# Patient Record
Sex: Female | Born: 1985 | Race: Black or African American | Hispanic: No | Marital: Married | Smoking: Never smoker
Health system: Southern US, Community
[De-identification: ages and names within clinical notes are randomized; demographics above are authoritative.]

## PROBLEM LIST (undated history)

## (undated) ENCOUNTER — Inpatient Hospital Stay (HOSPITAL_COMMUNITY): Payer: Medicaid Other

## (undated) ENCOUNTER — Inpatient Hospital Stay (HOSPITAL_COMMUNITY): Payer: Self-pay

## (undated) DIAGNOSIS — Z789 Other specified health status: Secondary | ICD-10-CM

## (undated) HISTORY — PX: NO PAST SURGERIES: SHX2092

---

## 2013-10-27 LAB — OB RESULTS CONSOLE GC/CHLAMYDIA
Chlamydia: NEGATIVE
GC PROBE AMP, GENITAL: NEGATIVE

## 2013-10-27 LAB — OB RESULTS CONSOLE RPR: RPR: NONREACTIVE

## 2013-10-27 LAB — OB RESULTS CONSOLE HIV ANTIBODY (ROUTINE TESTING): HIV: NONREACTIVE

## 2013-10-27 LAB — OB RESULTS CONSOLE PLATELET COUNT: Platelets: 193 10*3/uL

## 2013-10-27 LAB — OB RESULTS CONSOLE ABO/RH: RH Type: POSITIVE

## 2013-10-27 LAB — OB RESULTS CONSOLE ANTIBODY SCREEN: Antibody Screen: NEGATIVE

## 2013-10-27 LAB — OB RESULTS CONSOLE HEPATITIS B SURFACE ANTIGEN: Hepatitis B Surface Ag: NEGATIVE

## 2013-10-27 LAB — OB RESULTS CONSOLE HGB/HCT, BLOOD
HEMATOCRIT: 35 %
Hemoglobin: 11.3 g/dL

## 2013-10-27 LAB — OB RESULTS CONSOLE RUBELLA ANTIBODY, IGM: RUBELLA: IMMUNE

## 2013-10-27 LAB — SICKLE CELL SCREEN: Sickle Cell Screen: NEGATIVE

## 2014-02-20 ENCOUNTER — Encounter (HOSPITAL_COMMUNITY): Payer: Self-pay | Admitting: *Deleted

## 2014-02-20 ENCOUNTER — Inpatient Hospital Stay (HOSPITAL_COMMUNITY)
Admission: AD | Admit: 2014-02-20 | Discharge: 2014-02-20 | Disposition: A | Discharge status: Home / Self Care | Payer: Medicaid Other | Source: Ambulatory Visit | Attending: Family Medicine | Admitting: Family Medicine

## 2014-02-20 DIAGNOSIS — Z3A30 30 weeks gestation of pregnancy: Secondary | ICD-10-CM | POA: Diagnosis not present

## 2014-02-20 DIAGNOSIS — O26893 Other specified pregnancy related conditions, third trimester: Secondary | ICD-10-CM | POA: Insufficient documentation

## 2014-02-20 DIAGNOSIS — R040 Epistaxis: Secondary | ICD-10-CM | POA: Insufficient documentation

## 2014-02-20 DIAGNOSIS — O99013 Anemia complicating pregnancy, third trimester: Secondary | ICD-10-CM

## 2014-02-20 HISTORY — DX: Other specified health status: Z78.9

## 2014-02-20 LAB — CBC
HCT: 31 % — ABNORMAL LOW (ref 36.0–46.0)
Hemoglobin: 10.3 g/dL — ABNORMAL LOW (ref 12.0–15.0)
MCH: 30.2 pg (ref 26.0–34.0)
MCHC: 33.2 g/dL (ref 30.0–36.0)
MCV: 90.9 fL (ref 78.0–100.0)
PLATELETS: 143 10*3/uL — AB (ref 150–400)
RBC: 3.41 MIL/uL — ABNORMAL LOW (ref 3.87–5.11)
RDW: 13.6 % (ref 11.5–15.5)
WBC: 7.8 10*3/uL (ref 4.0–10.5)

## 2014-02-20 NOTE — Discharge Instructions (Signed)

## 2014-02-20 NOTE — MAU Note (Signed)
Pt here for Nose bleeding last night.  None today.  Pt states every time she blows her nose, she has blood to come out.  Pt had PNC in [place] up until September but hasn't had any since.  Awaiting on insurance to start.  Good fetal movement.  Denies vaginal bleeding or ROM.

## 2014-02-20 NOTE — MAU Provider Note (Signed)
History     CSN: [id number]  Arrival date and time: 02/20/14 1308   First Provider Initiated Contact with Patient 02/20/14 1402      Chief Complaint  Patient presents with  . Epistaxis   HPI  Pt is a 28 y/o G1P0 at 3429w5d presenting with epistaxis. Pt reports noticing bright, red blood in a paper towel after she blew her nose last night. She reports using 2 paper towels - both with spots of blood but neither saturated. She reports an initial clot when she first blew her nose. States resolved w/in 18 minutes on its own - did not apply any pressure to nares, but did clean w/ a q-tip. She has had no epistaxis since the episode last night. She denies any recent trauma or URI. She denies any history of nosebleeds. She reports her nose feeling dry this morning. She denies dizziness, LOC, headaches, shortness of breath, easily bleeding or bruising, rhinorrhea, cough, fever, chills, or vision changes.   She denies any contractions, vaginal bleeding, or loss of fluid. She reports feeling the baby move. Denies any complications this pregnancy. She was receiving [place]NC in [place] until moving here 2 weeks ago. She is still seeking PNC in the area.   Past Medical History  Diagnosis Date  . Medical history non-contributory     Past Surgical History  Procedure Laterality Date  . No past surgeries      History reviewed. No pertinent family history.  History  Substance Use Topics  . Smoking status: Never Smoker   . Smokeless tobacco: Not on file  . Alcohol Use: No    Allergies: No Known Allergies  Prescriptions prior to admission  Medication Sig Dispense Refill Last Dose  . Prenatal Vit-Min-FA-Fish Oil ([facility] PRENATAL GUMMY PO) Take 2 tablets by mouth daily.   02/20/2014 at Unknown time    Review of Systems  Constitutional: Negative for fever and chills.  HENT: Positive for nosebleeds. Negative for sore throat.   Eyes: Negative for blurred vision, double vision and discharge.   Respiratory: Negative for cough and shortness of breath.   Cardiovascular: Negative for leg swelling.  Gastrointestinal: Negative for blood in stool.  Genitourinary: Negative for hematuria.  Neurological: Negative for dizziness, loss of consciousness and headaches.   Physical Exam   Blood pressure 116/65, pulse 91, temperature 98.5 F (36.9 C), temperature source Oral, resp. rate 16.  Physical Exam  Constitutional: She is oriented to person, place, and time. She appears well-developed and well-nourished.  HENT:  Head: Normocephalic.  Nose: No mucosal edema, rhinorrhea or nasal septal hematoma. No epistaxis.  No foreign bodies.  Mouth/Throat: Oropharynx is clear and moist. No oropharyngeal exudate, posterior oropharyngeal edema or posterior oropharyngeal erythema.  Neck: Neck supple.  Cardiovascular: Normal rate, regular rhythm, normal heart sounds and intact distal pulses.   Respiratory: Effort normal and breath sounds normal. No respiratory distress. She has no wheezes. She has no rales.  GI: Soft. She exhibits no distension. There is no tenderness. There is no rebound.  Musculoskeletal: She exhibits no edema or tenderness.  Lymphadenopathy:    She has no cervical adenopathy.  Neurological: She is alert and oriented to person, place, and time.    MAU Course  Procedures  +FHT 145 bpm, variability 6-25 bpm, normal accels, no decels  - CBC: Hgb 10.3, PLT 143 Results for orders placed or performed during the hospital encounter of 02/20/14 (from the past 72 hour(s))  CBC     Status: Abnormal  Collection Time: 02/20/14  2:37 PM  Result Value Ref Range   WBC 7.8 4.0 - 10.5 K/uL   RBC 3.41 (L) 3.87 - 5.11 MIL/uL   Hemoglobin 10.3 (L) 12.0 - 15.0 g/dL   HCT 16.131.0 (L) 09.636.0 - 04.546.0 %   MCV 90.9 78.0 - 100.0 fL   MCH 30.2 26.0 - 34.0 pg   MCHC 33.2 30.0 - 36.0 g/dL   RDW 40.913.6 81.111.5 - 91.415.5 %   Platelets 143 (L) 150 - 400 K/uL    Assessment and Plan  A: 28 y/o G1P0 at 1473w5d with hx  of epistaxis last night, none today. Likely 2/2 mild nasal edema, dryness, trauma. Slightly low platelets, but no other signs of bleeding diathesis.   P: D/C home  - FeSO4 325 mg po BID for anemia  - Counseled patient on using a humidifier,nasal vasoline, nasal pressure to prevent future nosebleeds. - Strict precautions given nosebleeds that don't resolve w/in 10-20 min w/ nasal pressure - [facility] given to [facility] - List of medications safe to take in pregnancy given to patient  - Return to MAU if vaginal bleeding, regular strong contractions, loss of fluids, stop feeling the baby move.   Janalyn RouseHoward, Tayla 02/20/2014, 2:21 PM   I saw and evaluated the patient. I agree with the findings and the plan of care as documented in the resident's note. Elita Booneoberts, Atwood Adcock C

## 2014-03-03 ENCOUNTER — Telehealth: Payer: Self-pay | Admitting: Obstetrics

## 2014-03-03 NOTE — Telephone Encounter (Addendum)
Pt called on 02/23/2014 to be our pt at [facility]  [facility] came to complete release inf form it was faxed, on 11/19 I called pt and notified  her we do not received any paper yet from her old doctor . Pt stated that she  have her medical record with her, she brought an envelope with some paper but it was another [facility] inf [facility] [place]. Pt completed it and we faxed it  on 03/02/2014 we been faxed many time the release  inf form to [facility] but we do not received anything. On 03/03/2014 I called pt and let her know all the difficulties that we have to got her medical records.  Marines JeffersonJackson

## 2014-03-06 ENCOUNTER — Encounter: Payer: Self-pay | Admitting: *Deleted

## 2014-03-10 ENCOUNTER — Ambulatory Visit (INDEPENDENT_AMBULATORY_CARE_PROVIDER_SITE_OTHER): Payer: Medicaid Other | Admitting: Obstetrics

## 2014-03-10 ENCOUNTER — Encounter: Payer: Self-pay | Admitting: Obstetrics

## 2014-03-10 VITALS — BP 114/72 | HR 105 | Temp 98.1°F | Wt 162.0 lb

## 2014-03-10 DIAGNOSIS — Z3403 Encounter for supervision of normal first pregnancy, third trimester: Secondary | ICD-10-CM

## 2014-03-10 LAB — POCT URINALYSIS DIPSTICK
Bilirubin, UA: NEGATIVE
Blood, UA: NEGATIVE
GLUCOSE UA: NEGATIVE
Ketones, UA: NEGATIVE
Nitrite, UA: NEGATIVE
Protein, UA: NEGATIVE
Urobilinogen, UA: NEGATIVE
pH, UA: 6.5

## 2014-03-10 NOTE — Progress Notes (Signed)
Subjective:    Julie Winters is being seen today for her first obstetrical visit.  This is not a planned pregnancy. She is at 5089w2d gestation. Her obstetrical history is significant for no risk factors. Relationship with FOB: significant other, not living together. Patient does intend to breast feed. Pregnancy history fully reviewed.  The information documented in the HPI was reviewed and verified.  Menstrual History: OB History    Gravida Para Term Preterm AB TAB SAB Ectopic Multiple Living   1         0       No LMP recorded. Patient is pregnant.    Past Medical History  Diagnosis Date  . Medical history non-contributory     Past Surgical History  Procedure Laterality Date  . No past surgeries       (Not in a hospital admission) No Known Allergies  History  Substance Use Topics  . Smoking status: Never Smoker   . Smokeless tobacco: Not on file  . Alcohol Use: No    History reviewed. No pertinent family history.   Review of Systems Constitutional: negative for weight loss Gastrointestinal: negative for vomiting Genitourinary:negative for genital lesions and vaginal discharge and dysuria Musculoskeletal:negative for back pain Behavioral/Psych: negative for abusive relationship, depression, illegal drug usage and tobacco use    Objective:    BP 114/72 mmHg  Pulse 105  Temp(Src) 98.1 F (36.7 C)  Wt 162 lb (73.483 kg) General Appearance:    Alert, cooperative, no distress, appears stated age  Head:    Normocephalic, without obvious abnormality, atraumatic  Eyes:    PERRL, conjunctiva/corneas clear, EOM's intact, fundi    benign, both eyes  Ears:    Normal TM's and external ear canals, both ears  Nose:   Nares normal, septum midline, mucosa normal, no drainage    or sinus tenderness  Throat:   Lips, mucosa, and tongue normal; teeth and gums normal  Neck:   Supple, symmetrical, trachea midline, no adenopathy;    thyroid:  no enlargement/tenderness/nodules; no  carotid   bruit or JVD  Back:     Symmetric, no curvature, ROM normal, no CVA tenderness  Lungs:     Clear to auscultation bilaterally, respirations unlabored  Chest Wall:    No tenderness or deformity   Heart:    Regular rate and rhythm, S1 and S2 normal, no murmur, rub   or gallop  Breast Exam:    No tenderness, masses, or nipple abnormality  Abdomen:     Soft, non-tender, bowel sounds active all four quadrants,    no masses, no organomegaly  Genitalia:    Normal female without lesion, discharge or tenderness  Extremities:   Extremities normal, atraumatic, no cyanosis or edema  Pulses:   2+ and symmetric all extremities  Skin:   Skin color, texture, turgor normal, no rashes or lesions  Lymph nodes:   Cervical, supraclavicular, and axillary nodes normal  Neurologic:   CNII-XII intact, normal strength, sensation and reflexes    throughout      Lab Review Urine pregnancy test Labs reviewed yes Radiologic studies reviewed yes Assessment:    Pregnancy at 3789w2d weeks    Plan:      Prenatal vitamins.  Counseling provided regarding continued use of seat belts, cessation of alcohol consumption, smoking or use of illicit drugs; infection precautions i.e., influenza/TDAP immunizations, toxoplasmosis,CMV, parvovirus, listeria and varicella; workplace safety, exercise during pregnancy; routine dental care, safe medications, sexual activity, hot tubs, saunas, pools, travel,  caffeine use, fish and methlymercury, potential toxins, hair treatments, varicose veins Weight gain recommendations per IOM guidelines reviewed: underweight/BMI< 18.5--> gain 28 - 40 lbs; normal weight/BMI 18.5 - 24.9--> gain 25 - 35 lbs; overweight/BMI 25 - 29.9--> gain 15 - 25 lbs; obese/BMI >30->gain  11 - 20 lbs Problem list reviewed and updated. FIRST/CF mutation testing/NIPT/QUAD SCREEN/fragile X/Ashkenazi Jewish population testing/Spinal muscular atrophy discussed: results reviewed. Role of ultrasound in pregnancy  discussed; fetal survey: results reviewed. Amniocentesis discussed: not indicated. VBAC calculator score: VBAC consent form provided No orders of the defined types were placed in this encounter.   Orders Placed This Encounter  Procedures  . POCT urinalysis dipstick    Follow up in 1 weeks.

## 2014-03-21 ENCOUNTER — Encounter: Payer: Self-pay | Admitting: Obstetrics

## 2014-03-21 ENCOUNTER — Ambulatory Visit (INDEPENDENT_AMBULATORY_CARE_PROVIDER_SITE_OTHER): Payer: Medicaid Other | Admitting: Obstetrics

## 2014-03-21 VITALS — BP 111/66 | HR 87 | Temp 97.3°F | Wt 161.0 lb

## 2014-03-21 DIAGNOSIS — Z3403 Encounter for supervision of normal first pregnancy, third trimester: Secondary | ICD-10-CM

## 2014-03-21 LAB — POCT URINALYSIS DIPSTICK
Bilirubin, UA: NEGATIVE
Glucose, UA: NEGATIVE
Ketones, UA: NEGATIVE
Leukocytes, UA: NEGATIVE
Nitrite, UA: NEGATIVE
PROTEIN UA: NEGATIVE
RBC UA: NEGATIVE
Spec Grav, UA: <=1.005
Urobilinogen, UA: NEGATIVE
pH, UA: 7

## 2014-03-21 NOTE — Progress Notes (Signed)
Subjective:    Julie Winters is a 28 y.o. female being seen today for her obstetrical visit. She is at 1892w6d gestation. Patient reports no complaints. Fetal movement: normal.  Problem List Items Addressed This Visit    None    Visit Diagnoses    Encounter for supervision of normal first pregnancy in third trimester    -  Primary    Relevant Orders       POCT urinalysis dipstick      There are no active problems to display for this patient.  Objective:    BP 111/66 mmHg  Pulse 87  Temp(Src) 97.3 F (36.3 C)  Wt 161 lb (73.029 kg) FHT:  150 BPM  Uterine Size: size equals dates  Presentation: unsure     Assessment:    Pregnancy @ 7292w6d weeks   Plan:     labs reviewed, problem list updated Consent signed. GBS sent TDAP offered  Rhogam given for RH negative Pediatrician: discussed. Infant feeding: plans to breastfeed. Maternity leave: not discussed. Cigarette smoking: never smoked. Orders Placed This Encounter  Procedures  . POCT urinalysis dipstick   No orders of the defined types were placed in this encounter.   Follow up in 1 Week.

## 2014-03-28 ENCOUNTER — Ambulatory Visit (INDEPENDENT_AMBULATORY_CARE_PROVIDER_SITE_OTHER): Payer: Medicaid Other | Admitting: Obstetrics

## 2014-03-28 ENCOUNTER — Encounter: Payer: Self-pay | Admitting: Obstetrics

## 2014-03-28 VITALS — BP 108/70 | HR 99 | Temp 98.0°F | Wt 165.0 lb

## 2014-03-28 DIAGNOSIS — N898 Other specified noninflammatory disorders of vagina: Secondary | ICD-10-CM

## 2014-03-28 DIAGNOSIS — Z3403 Encounter for supervision of normal first pregnancy, third trimester: Secondary | ICD-10-CM

## 2014-03-28 LAB — POCT URINALYSIS DIPSTICK
BILIRUBIN UA: NEGATIVE
Blood, UA: 50
Glucose, UA: NEGATIVE
KETONES UA: NEGATIVE
Nitrite, UA: NEGATIVE
PH UA: 8
Protein, UA: NEGATIVE
Spec Grav, UA: <=1.005
Urobilinogen, UA: NEGATIVE

## 2014-03-28 LAB — OB RESULTS CONSOLE GBS: STREP GROUP B AG: NEGATIVE

## 2014-03-28 NOTE — Progress Notes (Signed)
Subjective:    Julie Winters is a [age] y.o. female being seen today for her obstetrical visit. She is at 3065w6d gestation. Patient reports no complaints. Fetal movement: normal.  Problem List Items Addressed This Visit    None    Visit Diagnoses    Encounter for supervision of normal first pregnancy in third trimester    -  Primary    Relevant Orders       POCT urinalysis dipstick (Completed)       Strep B DNA probe    Vaginal discharge        Relevant Orders       SureSwab, Vaginosis/Vaginitis Plus      There are no active problems to display for this patient.  Objective:    BP 108/70 mmHg  Pulse 99  Temp(Src) 98 F (36.7 C)  Wt 165 lb (74.844 kg) FHT:  150 BPM  Uterine Size: size equals dates  Presentation: unsure     Assessment:    Pregnancy @ 7265w6d weeks   Plan:     labs reviewed, problem list updated Consent signed. GBS sent TDAP offered  Rhogam given for RH negative Pediatrician: discussed. Infant feeding: plans to breastfeed. Maternity leave: discussed. Cigarette smoking: never smoked. Orders Placed This Encounter  Procedures  . Strep B DNA probe  . SureSwab, Vaginosis/Vaginitis Plus  . POCT urinalysis dipstick   No orders of the defined types were placed in this encounter.   Follow up in 1 Week.

## 2014-03-30 LAB — STREP B DNA PROBE: STREP GROUP B AG: NOT DETECTED

## 2014-03-31 LAB — SURESWAB, VAGINOSIS/VAGINITIS PLUS
Atopobium vaginae: 6.9 Log (cells/mL)
C. GLABRATA, DNA: NOT DETECTED
C. TROPICALIS, DNA: NOT DETECTED
C. albicans, DNA: NOT DETECTED
C. parapsilosis, DNA: NOT DETECTED
C. trachomatis RNA, TMA: NOT DETECTED
Gardnerella vaginalis: >8 Log (cells/mL)
LACTOBACILLUS SPECIES: NOT DETECTED Log (cells/mL)
MEGASPHAERA SPECIES: 7.7 Log (cells/mL)
N. GONORRHOEAE RNA, TMA: NOT DETECTED
T. vaginalis RNA, QL TMA: NOT DETECTED

## 2014-04-01 ENCOUNTER — Other Ambulatory Visit: Payer: Self-pay | Admitting: Obstetrics

## 2014-04-01 DIAGNOSIS — N760 Acute vaginitis: Principal | ICD-10-CM

## 2014-04-01 DIAGNOSIS — B9689 Other specified bacterial agents as the cause of diseases classified elsewhere: Secondary | ICD-10-CM

## 2014-04-01 MED ORDER — METRONIDAZOLE 500 MG PO TABS: 500.0000 mg | ORAL_TABLET | Freq: Two times a day (BID) | ORAL | Status: DC

## 2014-04-04 ENCOUNTER — Encounter: Payer: Self-pay | Admitting: Obstetrics

## 2014-04-04 ENCOUNTER — Ambulatory Visit (INDEPENDENT_AMBULATORY_CARE_PROVIDER_SITE_OTHER): Payer: Medicaid Other | Admitting: Obstetrics

## 2014-04-04 VITALS — BP 108/71 | HR 101 | Temp 98.2°F | Wt 169.0 lb

## 2014-04-04 DIAGNOSIS — Z3403 Encounter for supervision of normal first pregnancy, third trimester: Secondary | ICD-10-CM

## 2014-04-04 LAB — POCT URINALYSIS DIPSTICK
Bilirubin, UA: NEGATIVE
Blood, UA: NEGATIVE
Glucose, UA: NEGATIVE
Ketones, UA: NEGATIVE
LEUKOCYTES UA: NEGATIVE
Nitrite, UA: NEGATIVE
PROTEIN UA: NEGATIVE
Spec Grav, UA: <=1.005
UROBILINOGEN UA: NEGATIVE
pH, UA: 6

## 2014-04-04 NOTE — Progress Notes (Signed)
Subjective:    Julie Winters is a 28 y.o. female being seen today for her obstetrical visit. She is at 6171w6d gestation. Patient reports no complaints. Fetal movement: normal.  Problem List Items Addressed This Visit    None    Visit Diagnoses    Encounter for supervision of normal first pregnancy in third trimester    -  Primary    Relevant Orders       POCT urinalysis dipstick      There are no active problems to display for this patient.  Objective:    BP 108/71 mmHg  Pulse 101  Temp(Src) 98.2 F (36.8 C)  Wt 169 lb (76.658 kg) FHT:  150 BPM  Uterine Size: size equals dates  Presentation: unsure     Assessment:    Pregnancy @ 6371w6d weeks   Plan:     labs reviewed, problem list updated Consent signed. GBS sent TDAP offered  Rhogam given for RH negative Pediatrician: discussed. Infant feeding: plans to breastfeed. Maternity leave: discussed. Cigarette smoking: never smoked. Orders Placed This Encounter  Procedures  . POCT urinalysis dipstick   No orders of the defined types were placed in this encounter.   Follow up in 1 Week.

## 2014-04-13 ENCOUNTER — Ambulatory Visit (INDEPENDENT_AMBULATORY_CARE_PROVIDER_SITE_OTHER): Payer: Medicaid Other | Admitting: Obstetrics

## 2014-04-13 DIAGNOSIS — Z3403 Encounter for supervision of normal first pregnancy, third trimester: Secondary | ICD-10-CM

## 2014-04-13 LAB — POCT URINALYSIS DIPSTICK
Bilirubin, UA: NEGATIVE
Glucose, UA: NEGATIVE
Ketones, UA: NEGATIVE
Leukocytes, UA: NEGATIVE
Nitrite, UA: NEGATIVE
Protein, UA: NEGATIVE
RBC UA: NEGATIVE
UROBILINOGEN UA: NEGATIVE
pH, UA: 6

## 2014-04-14 ENCOUNTER — Encounter: Payer: Self-pay | Admitting: Obstetrics

## 2014-04-14 NOTE — Progress Notes (Signed)
Subjective:    Julie Winters is a 29 y.o. female being seen today for her obstetrical visit. She is at [redacted]w[redacted]d gestation. Patient reports no complaints. Fetal movement: normal.  Problem List Items Addressed This Visit    None    Visit Diagnoses    Encounter for supervision of normal first pregnancy in third trimester    -  Primary    Relevant Orders       POCT urinalysis dipstick (Completed)      There are no active problems to display for this patient.   Objective:    There were no vitals taken for this visit. FHT: 150 BPM  Uterine Size: size equals dates  Presentations: cephalic  Pelvic Exam: deferred    Assessment:    Pregnancy @ [redacted]w[redacted]d weeks   Plan:   Plans for delivery: Vaginal anticipated; labs reviewed; problem list updated Counseling: Consent signed. Infant feeding: plans to breastfeed. Cigarette smoking: never smoked. L&D discussion: symptoms of labor, discussed when to call, discussed what number to call, anesthetic/analgesic options reviewed and delivering clinician:  plans Physician. Postpartum supports and preparation: circumcision discussed and contraception plans discussed.  Follow up in 1 Week.

## 2014-04-20 ENCOUNTER — Encounter: Payer: Self-pay | Admitting: Obstetrics

## 2014-04-20 ENCOUNTER — Ambulatory Visit (INDEPENDENT_AMBULATORY_CARE_PROVIDER_SITE_OTHER): Payer: Medicaid Other | Admitting: Obstetrics

## 2014-04-20 VITALS — BP 123/70 | HR 94 | Temp 98.7°F | Wt 170.0 lb

## 2014-04-20 DIAGNOSIS — Z3403 Encounter for supervision of normal first pregnancy, third trimester: Secondary | ICD-10-CM

## 2014-04-20 LAB — POCT URINALYSIS DIPSTICK
Bilirubin, UA: NEGATIVE
Blood, UA: NEGATIVE
Glucose, UA: NEGATIVE
KETONES UA: NEGATIVE
LEUKOCYTES UA: NEGATIVE
Nitrite, UA: NEGATIVE
PH UA: 6
Protein, UA: NEGATIVE
Spec Grav, UA: 1.01
Urobilinogen, UA: NEGATIVE

## 2014-04-20 NOTE — Progress Notes (Signed)
Subjective:    Julie Winters is a 29 y.o. female being seen today for her obstetrical visit. She is at 1456w1d gestation. Patient reports no complaints. Fetal movement: normal.  Problem List Items Addressed This Visit    None    Visit Diagnoses    Encounter for supervision of normal first pregnancy in third trimester    -  Primary    Relevant Orders       POCT urinalysis dipstick      There are no active problems to display for this patient.   Objective:    BP 123/70 mmHg  Pulse 94  Temp(Src) 98.7 F (37.1 C)  Wt 170 lb (77.111 kg) FHT: 150 BPM  Uterine Size: size equals dates  Presentations: cephalic  Pelvic Exam:              Dilation: 1cm       Effacement: 50%             Station:  -3    Consistency: soft            Position: posterior     Assessment:    Pregnancy @ 2756w1d weeks   Plan:   Plans for delivery: Vaginal anticipated; labs reviewed; problem list updated Counseling: Consent signed. Infant feeding: plans to breastfeed. Cigarette smoking: never smoked. L&D discussion: symptoms of labor, discussed when to call, discussed what number to call, anesthetic/analgesic options reviewed and delivering clinician:  plans Physician. Postpartum supports and preparation: circumcision discussed and contraception plans discussed.  Follow up in 1 Week.

## 2014-04-27 ENCOUNTER — Other Ambulatory Visit: Payer: Self-pay | Admitting: *Deleted

## 2014-04-27 ENCOUNTER — Ambulatory Visit (INDEPENDENT_AMBULATORY_CARE_PROVIDER_SITE_OTHER): Payer: Medicaid Other | Admitting: Obstetrics

## 2014-04-27 ENCOUNTER — Encounter: Payer: Self-pay | Admitting: Obstetrics

## 2014-04-27 VITALS — BP 112/75 | HR 88 | Temp 98.2°F | Wt 173.0 lb

## 2014-04-27 DIAGNOSIS — O480 Post-term pregnancy: Secondary | ICD-10-CM

## 2014-04-27 DIAGNOSIS — Z3403 Encounter for supervision of normal first pregnancy, third trimester: Secondary | ICD-10-CM

## 2014-04-27 LAB — POCT URINALYSIS DIPSTICK
BILIRUBIN UA: NEGATIVE
Glucose, UA: NEGATIVE
KETONES UA: NEGATIVE
NITRITE UA: NEGATIVE
Protein, UA: NEGATIVE
RBC UA: NEGATIVE
Spec Grav, UA: 1.01
Urobilinogen, UA: NEGATIVE
pH, UA: 6

## 2014-04-28 ENCOUNTER — Telehealth (HOSPITAL_COMMUNITY): Payer: Self-pay | Admitting: *Deleted

## 2014-04-28 NOTE — Telephone Encounter (Signed)
Preadmission screen  

## 2014-05-02 NOTE — Progress Notes (Signed)
Subjective:    Julie Winters is a 29 y.o. female being seen today for her obstetrical visit. She is at 6936w6d gestation. Patient reports no complaints. Fetal movement: normal.  Problem List Items Addressed This Visit    None    Visit Diagnoses    Encounter for supervision of normal first pregnancy in third trimester    -  Primary    Relevant Orders    POCT urinalysis dipstick (Completed)      There are no active problems to display for this patient.   Objective:    BP 112/75 mmHg  Pulse 88  Temp(Src) 98.2 F (36.8 C)  Wt 173 lb (78.472 kg) FHT:  150 BPM  Uterine Size: size equals dates  Presentation: cephalic  Pelvic Exam:              Dilation: 2cm       Effacement: 50%   Station:  -3     Consistency: soft            Position: middle     Assessment:    Pregnancy @ 6636w6d  weeks   Plan:    Postdates management: discussed fetal surveillance and induction, discussed fetal movement, NST reactive. IOL scheduled.

## 2014-05-03 ENCOUNTER — Inpatient Hospital Stay (HOSPITAL_COMMUNITY)
Admission: AD | Admit: 2014-05-03 | Discharge: 2014-05-06 | DRG: 775 | Disposition: A | Discharge status: Home / Self Care | Payer: Medicaid Other | Source: Ambulatory Visit | Attending: Obstetrics | Admitting: Obstetrics

## 2014-05-03 DIAGNOSIS — O480 Post-term pregnancy: Secondary | ICD-10-CM | POA: Diagnosis present

## 2014-05-03 DIAGNOSIS — Z3A41 41 weeks gestation of pregnancy: Secondary | ICD-10-CM | POA: Diagnosis present

## 2014-05-03 LAB — CBC
HEMATOCRIT: 32.7 % — AB (ref 36.0–46.0)
Hemoglobin: 10.5 g/dL — ABNORMAL LOW (ref 12.0–15.0)
MCH: 27.8 pg (ref 26.0–34.0)
MCHC: 32.1 g/dL (ref 30.0–36.0)
MCV: 86.5 fL (ref 78.0–100.0)
Platelets: 163 10*3/uL (ref 150–400)
RBC: 3.78 MIL/uL — ABNORMAL LOW (ref 3.87–5.11)
RDW: 15.1 % (ref 11.5–15.5)
WBC: 10.6 10*3/uL — ABNORMAL HIGH (ref 4.0–10.5)

## 2014-05-03 MED ORDER — ACETAMINOPHEN 325 MG PO TABS
650.0000 mg | ORAL_TABLET | ORAL | Status: DC | PRN
  Administered 2014-05-03: 650 mg via ORAL
  Filled 2014-05-03: qty 2

## 2014-05-03 MED ORDER — LACTATED RINGERS IV SOLN
INTRAVENOUS | Status: DC
  Administered 2014-05-04 (×3): via INTRAVENOUS

## 2014-05-03 MED ORDER — CITRIC ACID-SODIUM CITRATE 334-500 MG/5ML PO SOLN: 30.0000 mL | ORAL | Status: DC | PRN

## 2014-05-03 MED ORDER — BUTORPHANOL TARTRATE 1 MG/ML IJ SOLN: 1.0000 mg | INTRAMUSCULAR | Status: DC | PRN

## 2014-05-03 MED ORDER — OXYTOCIN BOLUS FROM INFUSION
500.0000 mL | INTRAVENOUS | Status: DC
  Administered 2014-05-04: 500 mL via INTRAVENOUS

## 2014-05-03 MED ORDER — OXYTOCIN 40 UNITS IN LACTATED RINGERS INFUSION - SIMPLE MED
62.5000 mL/h | INTRAVENOUS | Status: DC
  Filled 2014-05-03: qty 1000

## 2014-05-03 MED ORDER — MISOPROSTOL 25 MCG QUARTER TABLET
25.0000 ug | ORAL_TABLET | ORAL | Status: DC
  Administered 2014-05-03 – 2014-05-04 (×2): 25 ug via VAGINAL
  Filled 2014-05-03: qty 1
  Filled 2014-05-03 (×2): qty 0.25
  Filled 2014-05-03 (×4): qty 1

## 2014-05-03 MED ORDER — LACTATED RINGERS IV SOLN: 500.0000 mL | INTRAVENOUS | Status: DC | PRN

## 2014-05-03 MED ORDER — OXYCODONE-ACETAMINOPHEN 5-325 MG PO TABS: 2.0000 | ORAL_TABLET | ORAL | Status: DC | PRN

## 2014-05-03 MED ORDER — TERBUTALINE SULFATE 1 MG/ML IJ SOLN: 0.2500 mg | Freq: Once | INTRAMUSCULAR | Status: AC | PRN

## 2014-05-03 MED ORDER — OXYCODONE-ACETAMINOPHEN 5-325 MG PO TABS: 1.0000 | ORAL_TABLET | ORAL | Status: DC | PRN

## 2014-05-03 MED ORDER — LIDOCAINE HCL (PF) 1 % IJ SOLN
30.0000 mL | INTRAMUSCULAR | Status: DC | PRN
  Filled 2014-05-03: qty 30

## 2014-05-03 MED ORDER — ZOLPIDEM TARTRATE 5 MG PO TABS
5.0000 mg | ORAL_TABLET | Freq: Every evening | ORAL | Status: DC | PRN
  Administered 2014-05-03: 5 mg via ORAL
  Filled 2014-05-03: qty 1

## 2014-05-03 MED ORDER — ONDANSETRON HCL 4 MG/2ML IJ SOLN: 4.0000 mg | Freq: Four times a day (QID) | INTRAMUSCULAR | Status: DC | PRN

## 2014-05-03 MED ORDER — HYDROXYZINE HCL 50 MG PO TABS
50.0000 mg | ORAL_TABLET | Freq: Four times a day (QID) | ORAL | Status: DC | PRN
  Filled 2014-05-03: qty 1

## 2014-05-04 ENCOUNTER — Encounter (HOSPITAL_COMMUNITY): Payer: Self-pay | Admitting: *Deleted

## 2014-05-04 ENCOUNTER — Inpatient Hospital Stay (HOSPITAL_COMMUNITY): Admission: RE | Admit: 2014-05-04 | Payer: Medicaid Other | Source: Ambulatory Visit

## 2014-05-04 ENCOUNTER — Inpatient Hospital Stay (HOSPITAL_COMMUNITY): Payer: Medicaid Other | Admitting: Anesthesiology

## 2014-05-04 LAB — ABO/RH: ABO/RH(D): A POS

## 2014-05-04 LAB — TYPE AND SCREEN
ABO/RH(D): A POS
Antibody Screen: NEGATIVE

## 2014-05-04 MED ORDER — LANOLIN HYDROUS EX OINT: TOPICAL_OINTMENT | CUTANEOUS | Status: DC | PRN

## 2014-05-04 MED ORDER — TETANUS-DIPHTH-ACELL PERTUSSIS 5-2.5-18.5 LF-MCG/0.5 IM SUSP: 0.5000 mL | Freq: Once | INTRAMUSCULAR | Status: DC

## 2014-05-04 MED ORDER — DIBUCAINE 1 % RE OINT: 1.0000 "application " | TOPICAL_OINTMENT | RECTAL | Status: DC | PRN

## 2014-05-04 MED ORDER — LACTATED RINGERS IV SOLN: 500.0000 mL | Freq: Once | INTRAVENOUS | Status: DC

## 2014-05-04 MED ORDER — SENNOSIDES-DOCUSATE SODIUM 8.6-50 MG PO TABS
2.0000 | ORAL_TABLET | ORAL | Status: DC
  Administered 2014-05-04 – 2014-05-06 (×2): 2 via ORAL
  Filled 2014-05-04 (×2): qty 2

## 2014-05-04 MED ORDER — OXYCODONE-ACETAMINOPHEN 5-325 MG PO TABS: 1.0000 | ORAL_TABLET | ORAL | Status: DC | PRN

## 2014-05-04 MED ORDER — PHENYLEPHRINE 40 MCG/ML (10ML) SYRINGE FOR IV PUSH (FOR BLOOD PRESSURE SUPPORT)
80.0000 ug | PREFILLED_SYRINGE | INTRAVENOUS | Status: DC | PRN
  Filled 2014-05-04: qty 2

## 2014-05-04 MED ORDER — ONDANSETRON HCL 4 MG/2ML IJ SOLN: 4.0000 mg | INTRAMUSCULAR | Status: DC | PRN

## 2014-05-04 MED ORDER — FENTANYL 2.5 MCG/ML BUPIVACAINE 1/10 % EPIDURAL INFUSION (WH - ANES)
INTRAMUSCULAR | Status: DC | PRN
  Administered 2014-05-04: 14 mL/h via EPIDURAL

## 2014-05-04 MED ORDER — IBUPROFEN 600 MG PO TABS
600.0000 mg | ORAL_TABLET | Freq: Four times a day (QID) | ORAL | Status: DC
  Administered 2014-05-04 – 2014-05-06 (×8): 600 mg via ORAL
  Filled 2014-05-04 (×8): qty 1

## 2014-05-04 MED ORDER — OXYTOCIN 40 UNITS IN LACTATED RINGERS INFUSION - SIMPLE MED: 62.5000 mL/h | INTRAVENOUS | Status: DC | PRN

## 2014-05-04 MED ORDER — BENZOCAINE-MENTHOL 20-0.5 % EX AERO
1.0000 "application " | INHALATION_SPRAY | CUTANEOUS | Status: DC | PRN
  Administered 2014-05-05: 1 via TOPICAL
  Filled 2014-05-04: qty 56

## 2014-05-04 MED ORDER — OXYCODONE-ACETAMINOPHEN 5-325 MG PO TABS: 2.0000 | ORAL_TABLET | ORAL | Status: DC | PRN

## 2014-05-04 MED ORDER — PRENATAL MULTIVITAMIN CH
1.0000 | ORAL_TABLET | Freq: Every day | ORAL | Status: DC
  Administered 2014-05-05 – 2014-05-06 (×2): 1 via ORAL
  Filled 2014-05-04 (×2): qty 1

## 2014-05-04 MED ORDER — ZOLPIDEM TARTRATE 5 MG PO TABS: 5.0000 mg | ORAL_TABLET | Freq: Every evening | ORAL | Status: DC | PRN

## 2014-05-04 MED ORDER — FENTANYL 2.5 MCG/ML BUPIVACAINE 1/10 % EPIDURAL INFUSION (WH - ANES)
14.0000 mL/h | INTRAMUSCULAR | Status: DC | PRN
  Administered 2014-05-04 (×2): 14 mL/h via EPIDURAL
  Filled 2014-05-04 (×2): qty 125

## 2014-05-04 MED ORDER — PHENYLEPHRINE 40 MCG/ML (10ML) SYRINGE FOR IV PUSH (FOR BLOOD PRESSURE SUPPORT)
80.0000 ug | PREFILLED_SYRINGE | INTRAVENOUS | Status: DC | PRN
  Filled 2014-05-04: qty 2
  Filled 2014-05-04: qty 20

## 2014-05-04 MED ORDER — BUPIVACAINE HCL (PF) 0.25 % IJ SOLN
INTRAMUSCULAR | Status: DC | PRN
  Administered 2014-05-04 (×2): 4 mL via EPIDURAL

## 2014-05-04 MED ORDER — LIDOCAINE-EPINEPHRINE (PF) 2 %-1:200000 IJ SOLN
INTRAMUSCULAR | Status: DC | PRN
  Administered 2014-05-04: 3 mL

## 2014-05-04 MED ORDER — WITCH HAZEL-GLYCERIN EX PADS: 1.0000 "application " | MEDICATED_PAD | CUTANEOUS | Status: DC | PRN

## 2014-05-04 MED ORDER — ONDANSETRON HCL 4 MG PO TABS: 4.0000 mg | ORAL_TABLET | ORAL | Status: DC | PRN

## 2014-05-04 MED ORDER — SIMETHICONE 80 MG PO CHEW: 80.0000 mg | CHEWABLE_TABLET | ORAL | Status: DC | PRN

## 2014-05-04 MED ORDER — PROMETHAZINE HCL 25 MG/ML IJ SOLN
12.5000 mg | Freq: Four times a day (QID) | INTRAMUSCULAR | Status: DC | PRN
  Administered 2014-05-04: 12.5 mg via INTRAMUSCULAR
  Filled 2014-05-04: qty 1

## 2014-05-04 MED ORDER — EPHEDRINE 5 MG/ML INJ
10.0000 mg | INTRAVENOUS | Status: DC | PRN
  Filled 2014-05-04: qty 2

## 2014-05-04 MED ORDER — DIPHENHYDRAMINE HCL 50 MG/ML IJ SOLN: 12.5000 mg | INTRAMUSCULAR | Status: DC | PRN

## 2014-05-04 MED ORDER — NALBUPHINE HCL 10 MG/ML IJ SOLN
10.0000 mg | INTRAMUSCULAR | Status: DC | PRN
  Administered 2014-05-04: 10 mg via INTRAVENOUS
  Filled 2014-05-04: qty 1

## 2014-05-04 MED ORDER — DIPHENHYDRAMINE HCL 25 MG PO CAPS: 25.0000 mg | ORAL_CAPSULE | Freq: Four times a day (QID) | ORAL | Status: DC | PRN

## 2014-05-04 MED ORDER — NALBUPHINE HCL 10 MG/ML IJ SOLN
10.0000 mg | INTRAMUSCULAR | Status: DC | PRN
  Administered 2014-05-04: 10 mg via INTRAMUSCULAR
  Filled 2014-05-04: qty 1

## 2014-05-04 NOTE — Anesthesia Preprocedure Evaluation (Signed)
Anesthesia Evaluation  Patient identified by MRN, date of birth, ID band Patient awake    Reviewed: Allergy & Precautions, NPO status , Patient's Chart, lab work & pertinent test results  History of Anesthesia Complications Negative for: history of anesthetic complications  Airway Mallampati: II  TM Distance: >3 FB Neck ROM: Full    Dental  (+) Teeth Intact   Pulmonary neg pulmonary ROS,  breath sounds clear to auscultation        Cardiovascular negative cardio ROS  Rhythm:Regular     Neuro/Psych negative neurological ROS  negative psych ROS   GI/Hepatic negative GI ROS, Neg liver ROS,   Endo/Other  negative endocrine ROS  Renal/GU negative Renal ROS     Musculoskeletal   Abdominal   Peds  Hematology  (+) anemia ,   Anesthesia Other Findings   Reproductive/Obstetrics (+) Pregnancy                             Anesthesia Physical Anesthesia Plan  ASA: II  Anesthesia Plan: Epidural   Post-op Pain Management:    Induction:   Airway Management Planned:   Additional Equipment:   Intra-op Plan:   Post-operative Plan:   Informed Consent: I have reviewed the patients History and Physical, chart, labs and discussed the procedure including the risks, benefits and alternatives for the proposed anesthesia with the patient or authorized representative who has indicated his/her understanding and acceptance.     Plan Discussed with: Anesthesiologist  Anesthesia Plan Comments:         Anesthesia Quick Evaluation  

## 2014-05-04 NOTE — Anesthesia Procedure Notes (Signed)
Epidural Patient location during procedure: OB  Staffing Anesthesiologist: Delando Satter, CHRIS Performed by: anesthesiologist   Preanesthetic Checklist Completed: patient identified, surgical consent, pre-op evaluation, timeout performed, IV checked, risks and benefits discussed and monitors and equipment checked  Epidural Patient position: sitting Prep: site prepped and draped and DuraPrep Patient monitoring: heart rate, cardiac monitor, continuous pulse ox and blood pressure Approach: midline Location: L4-L5 Injection technique: LOR saline  Needle:  Needle type: Tuohy  Needle gauge: 17 G Needle length: 9 cm Needle insertion depth: 5 cm Catheter type: closed end flexible Catheter size: 19 Gauge Catheter at skin depth: 12 cm Test dose: negative and 2% lidocaine with Epi 1:200 K  Assessment Events: blood not aspirated, injection not painful, no injection resistance, negative IV test and no paresthesia  Additional Notes H+P and labs checked, risks and benefits discussed with the patient, consent obtained, procedure tolerated well and without complications.  Reason for block:procedure for pain   

## 2014-05-04 NOTE — Progress Notes (Signed)
Julie Winters is a 29 y.o. G1P0 at 5117w1d by LMP admitted for induction of labor due to Post dates. Due date 04-26-14.  Subjective:   Objective: BP 76/58 mmHg  Pulse 164  Temp(Src) 97.7 F (36.5 C) (Axillary)  Resp 18  Ht 5\' 7"  (1.702 m)  Wt 173 lb (78.472 kg)  BMI 27.09 kg/m2      FHT:  FHR: 140 bpm, variability: moderate,  accelerations:  Present,  decelerations:  Present variable UC:   regular, every 2-4 minutes SVE:   Dilation: 7 Effacement (%): 90 Station: -1, -2 Exam by:: LCarpenter,RN  Labs: Lab Results  Component Value Date   WBC 10.6* 05/03/2014   HGB 10.5* 05/03/2014   HCT 32.7* 05/03/2014   MCV 86.5 05/03/2014   PLT 163 05/03/2014    Assessment / Plan: Induction of labor due to postterm,  progressing well on pitocin  Labor: Progressing normally Preeclampsia:  n/a Fetal Wellbeing:  Category I Pain Control:  Epidural I/D:  n/a Anticipated MOD:  NSVD  Shalin Linders A 05/04/2014, 9:24 AM

## 2014-05-04 NOTE — Progress Notes (Signed)
Arley Phenixbigail Owusu is a 29 y.o. G1P0 at 5672w1d by LMP admitted for induction of labor due to Post dates. Due date 04-26-14.  Subjective:   Objective: BP 131/74 mmHg  Pulse 72  Temp(Src) 98.9 F (37.2 C) (Oral)  Resp 20  Ht 5\' 7"  (1.702 m)  Wt 173 lb (78.472 kg)  BMI 27.09 kg/m2      FHT:  FHR: 130 bpm, variability: moderate,  accelerations:  Present,  decelerations:  Present variable UC:   regular, every 2-4 minutes SVE:   Dilation: 6.5 Effacement (%): 90 Station: -2 Exam by:: LCarpenter,RN  Labs: Lab Results  Component Value Date   WBC 10.6* 05/03/2014   HGB 10.5* 05/03/2014   HCT 32.7* 05/03/2014   MCV 86.5 05/03/2014   PLT 163 05/03/2014    Assessment / Plan: Induction of labor due to postterm,  progressing well on pitocin  Labor: Progressing normally Preeclampsia:  n/a Fetal Wellbeing:  Category I Pain Control:  Epidural I/D:  n/a Anticipated MOD:  NSVD  Dontae Minerva A 05/04/2014, 5:15 AM

## 2014-05-04 NOTE — H&P (Signed)
Julie Winters is a 29 y.o. female presenting for IOL. Maternal Medical History:  Fetal activity: Perceived fetal activity is normal.   Last perceived fetal movement was within the past hour.    Prenatal complications: no prenatal complications   OB History    Gravida Para Term Preterm AB TAB SAB Ectopic Multiple Living   1         0     Past Medical History  Diagnosis Date  . Medical history non-contributory    Past Surgical History  Procedure Laterality Date  . No past surgeries     Family History: family history is not on file. Social History:  reports that she has never smoked. She does not have any smokeless tobacco history on file. She reports that she does not drink alcohol or use illicit drugs.   Prenatal Transfer Tool  Maternal Diabetes: No Genetic Screening: Normal Maternal Ultrasounds/Referrals: Normal Fetal Ultrasounds or other Referrals:  None Maternal Substance Abuse:  No Significant Maternal Medications:  None Significant Maternal Lab Results:  None Other Comments:  None  Review of Systems  All other systems reviewed and are negative.   Dilation: 6.5 Effacement (%): 90 Station: -2 Exam by:: LCarpenter,RN Blood pressure 131/74, pulse 72, temperature 98.9 F (37.2 C), temperature source Oral, resp. rate 20, height 5\' 7"  (1.702 m), weight 173 lb (78.472 kg). Maternal Exam:  Abdomen: Patient reports no abdominal tenderness. Fetal presentation: vertex  Introitus: Normal vulva. Normal vagina.  Pelvis: adequate for delivery.   Cervix: Cervix evaluated by digital exam.     Physical Exam  Nursing note and vitals reviewed. Constitutional: She is oriented to person, place, and time. She appears well-developed and well-nourished.  HENT:  Head: Normocephalic and atraumatic.  Eyes: Conjunctivae are normal. Pupils are equal, round, and reactive to light.  Neck: Normal range of motion. Neck supple.  Cardiovascular: Normal rate and regular rhythm.    Respiratory: Effort normal and breath sounds normal.  GI: Soft.  Genitourinary: Vagina normal and uterus normal.  Musculoskeletal: Normal range of motion.  Neurological: She is alert and oriented to person, place, and time.  Skin: Skin is warm and dry.  Psychiatric: She has a normal mood and affect. Her behavior is normal. Judgment and thought content normal.    Prenatal labs: ABO, Rh: --/--/A POS, A POS (01/20 1930) Antibody: NEG (01/20 1930) Rubella: Immune (07/16 0000) RPR: Nonreactive (07/16 0000)  HBsAg: Negative (07/16 0000)  HIV: Non-reactive (07/16 0000)  GBS: NOT DETECTED (12/15 1551)   Assessment/Plan: Postdates.  Induction of Labor.   HARPER,CHARLES A 05/04/2014, 5:11 AM

## 2014-05-04 NOTE — Progress Notes (Signed)
Arley Phenixbigail Owusu is a 29 y.o. G1P0 at 5944w1d by LMP admitted for induction of labor due to Post dates. Due date 04-26-14.  Subjective:   Objective: BP 123/76 mmHg  Pulse 84  Temp(Src) 97.7 F (36.5 C) (Axillary)  Resp 18  Ht 5\' 7"  (1.702 m)  Wt 173 lb (78.472 kg)  BMI 27.09 kg/m2      FHT:  FHR: 150 bpm, variability: moderate,  accelerations:  Abscent,  decelerations:  Present variable UC:   regular, every 2-4 minutes SVE:   Dilation: Lip/rim Effacement (%): 100 Station: 0 Exam by:: k fields, rn  Labs: Lab Results  Component Value Date   WBC 10.6* 05/03/2014   HGB 10.5* 05/03/2014   HCT 32.7* 05/03/2014   MCV 86.5 05/03/2014   PLT 163 05/03/2014    Assessment / Plan: Induction of labor due to postterm,  progressing well on pitocin  Labor: Progressing normally Preeclampsia:  n/a Fetal Wellbeing:  Category I Pain Control:  Epidural I/D:  n/a Anticipated MOD:  NSVD  HARPER,CHARLES A 05/04/2014, 12:25 PM

## 2014-05-05 LAB — RPR: RPR: NONREACTIVE

## 2014-05-05 LAB — CBC
HCT: 28.5 % — ABNORMAL LOW (ref 36.0–46.0)
Hemoglobin: 9.2 g/dL — ABNORMAL LOW (ref 12.0–15.0)
MCH: 28 pg (ref 26.0–34.0)
MCHC: 32.3 g/dL (ref 30.0–36.0)
MCV: 86.6 fL (ref 78.0–100.0)
PLATELETS: 150 10*3/uL (ref 150–400)
RBC: 3.29 MIL/uL — ABNORMAL LOW (ref 3.87–5.11)
RDW: 15.4 % (ref 11.5–15.5)
WBC: 16.1 10*3/uL — ABNORMAL HIGH (ref 4.0–10.5)

## 2014-05-05 NOTE — Progress Notes (Signed)
Post Partum Day 1 Subjective: no complaints  Objective: Blood pressure 99/55, pulse 91, temperature 98.5 F (36.9 C), temperature source Oral, resp. rate 18, height 5\' 7"  (1.702 m), weight 173 lb (78.472 kg), unknown if currently breastfeeding.  Physical Exam:  General: alert and no distress Lochia: appropriate Uterine Fundus: firm Incision: none DVT Evaluation: No evidence of DVT seen on physical exam.   Recent Labs  05/03/14 1930 05/05/14 0555  HGB 10.5* 9.2*  HCT 32.7* 28.5*    Assessment/Plan: Plan for discharge tomorrow   LOS: 2 days   Shauntavia Brackin A 05/05/2014, 8:58 AM

## 2014-05-05 NOTE — Lactation Note (Signed)
This note was copied from the chart of Julie Mayetta Winters. Lactation Consultation Note: Initial visit with mom. Baby now 8917 hours old. Mom reports that baby has been nursing well but she is not sure how much the baby is getting. Reports the RN showed her hand expression and she got a few drops. Reviewed babies fed small amounts frequently. Reviewed feeding cues and encouraged to feed whenever she sees them. Baby asleep in bassinet and mom reports she fed 1 hour ago, Last LS 9 by RN. BF brochure given with resources for support after DC. No further questions at present. To call for assist prn  Patient Name: Julie Winters ZOXWR'UToday's Date: 05/05/2014 Reason for consult: Initial assessment   Maternal Data Formula Feeding for Exclusion: Yes Reason for exclusion: Mother's choice to formula and breast feed on admission Has patient been taught Hand Expression?: Yes Does the patient have breastfeeding experience prior to this delivery?: No  Feeding    LATCH Score/Interventions                      Lactation Tools Discussed/Used WIC Program: Yes   Consult Status Consult Status: Follow-up Date: 05/06/14 Follow-up type: In-patient    Pamelia HoitWeeks, Reyli Schroth D 05/05/2014, 8:58 AM

## 2014-05-06 NOTE — Discharge Summary (Signed)
Obstetric Discharge Summary Reason for Admission: onset of labor Prenatal Procedures: none Intrapartum Procedures: spontaneous vaginal delivery Postpartum Procedures: none Complications-Operative and Postpartum: none HEMOGLOBIN  Date Value Ref Range Status  05/05/2014 9.2* 12.0 - 15.0 g/dL Final  21/30/865707/16/2015 84.611.3 g/dL Final   HCT  Date Value Ref Range Status  05/05/2014 28.5* 36.0 - 46.0 % Final  10/27/2013 35 % Final    Physical Exam:  General: alert Lochia: appropriate Uterine Fundus: firm Incision: healing well DVT Evaluation: No evidence of DVT seen on physical exam.  Discharge Diagnoses: Term Pregnancy-delivered  Discharge Information: Date: 05/06/2014 Activity: pelvic rest Diet: routine Medications: Percocet Condition: improved Instructions: refer to practice specific booklet Discharge to: home Follow-up Information    Follow up with HARPER,CHARLES A, MD.   Specialty:  Obstetrics and Gynecology   Contact information:   [address] [place] [id number] [phone]       Newborn Data: Live born female  Birth Weight: 6 lb 8.4 oz (2960 g) APGAR: 9, 9  Home with mother.  Julie Winters A 05/06/2014, 7:40 AM

## 2014-05-06 NOTE — Discharge Instructions (Signed)
Discharge instructions ° °· You can wash your hair °· Shower °· Eat what you want °· Drink what you want °· See me in 6 weeks °· Your ankles are going to swell more in the next 2 weeks than when pregnant °· No sex for 6 weeks ° ° °Jessa Stinson A, MD 05/06/2014 ° ° °

## 2014-05-12 NOTE — Progress Notes (Signed)
Ur chart review completed.  

## 2014-05-24 ENCOUNTER — Ambulatory Visit: Payer: Medicaid Other | Admitting: Obstetrics

## 2014-05-25 ENCOUNTER — Encounter: Payer: Self-pay | Admitting: Certified Nurse Midwife

## 2014-05-25 ENCOUNTER — Ambulatory Visit (INDEPENDENT_AMBULATORY_CARE_PROVIDER_SITE_OTHER): Payer: Medicaid Other | Admitting: Certified Nurse Midwife

## 2014-05-25 VITALS — BP 108/74 | HR 76 | Temp 97.5°F | Ht 67.0 in | Wt 151.0 lb

## 2014-05-25 DIAGNOSIS — R52 Pain, unspecified: Secondary | ICD-10-CM

## 2014-05-25 MED ORDER — IBUPROFEN 800 MG PO TABS: 800.0000 mg | ORAL_TABLET | Freq: Three times a day (TID) | ORAL | Status: DC | PRN

## 2014-05-25 NOTE — Progress Notes (Signed)
Patient ID: Julie Winters, female   DOB: 1985-05-26, 29 y.o.   MRN: [id number]  Subjective:     Julie Winters is a 29 y.o. female who presents for a postpartum visit. She is 3 weeks postpartum following a spontaneous vaginal delivery. I have fully reviewed the prenatal and intrapartum course. The delivery was at 41.1 gestational weeks. Outcome: spontaneous vaginal delivery. Anesthesia: epidural. Postpartum course has been uncomplicated. Baby's course has been uncomplicated. Baby is feeding by breast. Bleeding thin lochia. Bowel function is normal. Bladder function is normal. Patient is not sexually active. Contraception method is condoms. Postpartum depression screening: negative.  Tobacco, alcohol and substance abuse history reviewed.  Adult immunizations reviewed including TDAP, rubella and varicella. The only complaint she has is occasional back pain.  States spouse is out of the country and she doesn't need contraception at this time.    The following portions of the patient's history were reviewed and updated as appropriate: allergies, current medications, past family history, past medical history, past social history, past surgical history and problem list.  Review of Systems A comprehensive review of systems was negative.   Objective:    BP 108/74 mmHg  Pulse 76  Temp(Src) 97.5 F (36.4 C)  Ht 5\' 7"  (1.702 m)  Wt 68.493 kg (151 lb)  BMI 23.64 kg/m2  Breastfeeding? Yes  General:  alert, cooperative and no distress   Breasts:  negative  Lungs: clear to auscultation bilaterally  Heart:  regular rate and rhythm, S1, S2 normal, no murmur, click, rub or gallop  Abdomen: soft, non-tender; bowel sounds normal; no masses,  no organomegaly   Vulva:  not evaluated  Vagina: not evaluated          75% of 10 min visit spent on counseling and coordination of care.  Assessment:     Normal 3 week postpartum exam.   Plan:    1. Contraception: does not desire at this time.  2. Follow up in: 3  weeks or as needed.  3. Breastfeeding support/counseling 4. Rx for 800mg  Ibuprofen TID, PRN back pain Preconception counseling provided Healthy lifestyle practices reviewed

## 2014-06-15 ENCOUNTER — Encounter: Payer: Self-pay | Admitting: Certified Nurse Midwife

## 2014-06-15 ENCOUNTER — Ambulatory Visit (INDEPENDENT_AMBULATORY_CARE_PROVIDER_SITE_OTHER): Payer: Medicaid Other | Admitting: Certified Nurse Midwife

## 2014-06-15 MED ORDER — PRENATE RESTORE 27-0.6-0.4-400 MG PO CAPS: 1.0000 | ORAL_CAPSULE | Freq: Every day | ORAL | Status: AC

## 2014-06-15 NOTE — Progress Notes (Signed)
Patient ID: Julie Winters, female   DOB: Aug 03, 1985, 29 y.o.   MRN: [id number] Subjective:     Julie Winters is a 29 y.o. female who presents for a postpartum visit. She is 7 weeks postpartum following a spontaneous vaginal delivery. I have fully reviewed the prenatal and intrapartum course. The delivery was at 41.1 gestational weeks. Outcome: spontaneous vaginal delivery. Anesthesia: epidural. Postpartum course has been normal. Baby's course has been normal. Baby is feeding by both breast and bottle - Enfamil with Iron. Bleeding thin lochia. Bowel function is normal. Bladder function is normal. Patient is not sexually active, spouse is overseas in [place] for over a year. Contraception method is abstinence. Postpartum depression screening: negative.  Tobacco, alcohol and substance abuse history reviewed.  Adult immunizations reviewed including TDAP, rubella and varicella.  The following portions of the patient's history were reviewed and updated as appropriate: allergies, current medications, past family history, past medical history, past social history, past surgical history and problem list.  Review of Systems A comprehensive review of systems was negative.   Objective:    BP 94/71 mmHg  Pulse 92  Temp(Src) 98.1 F (36.7 C)  Ht 5\' 7"  (1.702 m)  Wt 67.586 kg (149 lb)  BMI 23.33 kg/m2  Breastfeeding? Yes  General:  alert, cooperative and no distress   Breasts:  inspection negative, no nipple discharge or bleeding, no masses or nodularity palpable  Lungs: clear to auscultation bilaterally  Heart:  regular rate and rhythm, S1, S2 normal, no murmur, click, rub or gallop  Abdomen: soft, non-tender; bowel sounds normal; no masses,  no organomegaly   Vulva:  normal  Vagina: vagina positive for tender to palpation, slightly edematous  Cervix:  retroverted  Corpus: normal size, contour, position, consistency, mobility, non-tender  Adnexa:  normal adnexa  Rectal Exam: Not performed.           Assessment:     Normal postpartum exam. Pap smear not done at today's visit.  Plan:    1. Contraception: abstinence 2. Pap Smear/Annual exam after July 18th, 2016 3. Follow up in: 1 month or as needed, for vaginal tenderness.  Preconception counseling provided.  Prenatal vitamins sent to the pharmacy.   Healthy lifestyle practices reviewed

## 2014-07-18 ENCOUNTER — Ambulatory Visit (INDEPENDENT_AMBULATORY_CARE_PROVIDER_SITE_OTHER): Payer: Medicaid Other | Admitting: Certified Nurse Midwife

## 2014-07-18 ENCOUNTER — Encounter: Payer: Self-pay | Admitting: Certified Nurse Midwife

## 2014-07-18 DIAGNOSIS — M79632 Pain in left forearm: Secondary | ICD-10-CM | POA: Diagnosis not present

## 2014-07-18 MED ORDER — CITRANATAL HARMONY 27-1-260 MG PO CAPS: 1.0000 | ORAL_CAPSULE | Freq: Every day | ORAL | Status: DC

## 2014-07-18 NOTE — Progress Notes (Signed)
Patient ID: Julie Winters, female   DOB: March 20, 1986, 29 y.o.   MRN: [id number]   Chief Complaint  Patient presents with  . Follow-up    HPI Julie Winters is a 29 y.o. female.  C/O left forearm pain that started a week ago, denies any trauma, or carrying heavy items with that arm.  Breastfeeding is going well.  Denies any vaginal pain.  Not currently sexually active.  Reports taking her vitamin supplements.     HPI  Past Medical History  Diagnosis Date  . Medical history non-contributory     Past Surgical History  Procedure Laterality Date  . No past surgeries      No family history on file.  Social History History  Substance Use Topics  . Smoking status: Never Smoker   . Smokeless tobacco: Not on file  . Alcohol Use: No    No Known Allergies  Current Outpatient Prescriptions  Medication Sig Dispense Refill  . ibuprofen (ADVIL,MOTRIN) 800 MG tablet Take 1 tablet (800 mg total) by mouth every 8 (eight) hours as needed. 90 tablet 3  . Prenat w/o A-FE-Methfol-FA-DHA (PRENATE RESTORE) 27-0.6-0.4-400 MG CAPS Take 1 tablet by mouth daily. 30 capsule 12  . Prenat-FeFmCb-DSS-FA-DHA w/o A (CITRANATAL HARMONY) 27-1-260 MG CAPS Take 1 tablet by mouth daily. 30 capsule 12   No current facility-administered medications for this visit.    Review of Systems Review of Systems Constitutional: negative for fatigue and weight loss Respiratory: negative for cough and wheezing Cardiovascular: negative for chest pain, fatigue and palpitations Gastrointestinal: negative for abdominal pain and change in bowel habits Genitourinary:negative Integument/breast: negative for nipple discharge Musculoskeletal:negative for myalgias, + left arm pain of unknown origin Neurological: negative for gait problems and tremors Behavioral/Psych: negative for abusive relationship, depression Endocrine: negative for temperature intolerance     Blood pressure 103/70, pulse 72, temperature 98 F (36.7 C),  weight 67.132 kg (148 lb), currently breastfeeding.  Physical Exam Physical Exam General:   alert  Skin:   no rash or abnormalities  Lungs:   clear to auscultation bilaterally  Heart:   regular rate and rhythm, S1, S2 normal, no murmur, click, rub or gallop  Breasts:   normal without suspicious masses, skin or nipple changes or axillary nodes  Abdomen:  normal findings: no organomegaly, soft, non-tender and no hernia  Pelvis:  deferred    95% of 15 min visit spent on counseling and coordination of care.   Data Reviewed Previous medical hx, labs, medications  Assessment     Stable postpartum breastfeeding mother ? Left forearm bruising     Plan    No orders of the defined types were placed in this encounter.   Meds ordered this encounter  Medications  . Prenat-FeFmCb-DSS-FA-DHA w/o A (CITRANATAL HARMONY) 27-1-260 MG CAPS    Sig: Take 1 tablet by mouth daily.    Dispense:  30 capsule    Refill:  12    Follow up as needed.

## 2019-01-20 ENCOUNTER — Ambulatory Visit (INDEPENDENT_AMBULATORY_CARE_PROVIDER_SITE_OTHER): Payer: Medicaid Other | Admitting: Nurse Practitioner

## 2019-01-20 ENCOUNTER — Encounter: Payer: Self-pay | Admitting: Nurse Practitioner

## 2019-01-20 ENCOUNTER — Other Ambulatory Visit (HOSPITAL_COMMUNITY)
Admission: RE | Admit: 2019-01-20 | Discharge: 2019-01-20 | Disposition: A | Discharge status: Home / Self Care | Payer: Medicaid Other | Source: Ambulatory Visit | Attending: Nurse Practitioner | Admitting: Nurse Practitioner

## 2019-01-20 ENCOUNTER — Other Ambulatory Visit: Payer: Self-pay

## 2019-01-20 VITALS — BP 116/73 | HR 96 | Wt 153.8 lb

## 2019-01-20 DIAGNOSIS — Z3A12 12 weeks gestation of pregnancy: Secondary | ICD-10-CM

## 2019-01-20 DIAGNOSIS — Z3490 Encounter for supervision of normal pregnancy, unspecified, unspecified trimester: Secondary | ICD-10-CM | POA: Insufficient documentation

## 2019-01-20 DIAGNOSIS — O2341 Unspecified infection of urinary tract in pregnancy, first trimester: Secondary | ICD-10-CM

## 2019-01-20 DIAGNOSIS — K117 Disturbances of salivary secretion: Secondary | ICD-10-CM | POA: Insufficient documentation

## 2019-01-20 MED ORDER — GLYCOPYRROLATE 2 MG PO TABS: 2.0000 mg | ORAL_TABLET | Freq: Three times a day (TID) | ORAL | 4 refills | Status: DC

## 2019-01-20 MED ORDER — VITAFOL ULTRA 29-0.6-0.4-200 MG PO CAPS: 1.0000 | ORAL_CAPSULE | Freq: Every day | ORAL | 11 refills | Status: AC

## 2019-01-20 NOTE — Progress Notes (Signed)
Subjective:   Julie Winters is a 33 y.o. G3P1011 at [redacted]w[redacted]d by LMP being seen today for her first obstetrical visit.  Her obstetrical history is significant for spitting - no other problems with this pregnancy or last pregnancy. Patient does intend to breast feed. Pregnancy history fully reviewed.  Patient reports spitting.  HISTORY: OB History  Gravida Para Term Preterm AB Living  3 1 1  0 1 1  SAB TAB Ectopic Multiple Live Births  0 1 0 0 1    # Outcome Date GA Lbr Len/2nd Weight Sex Delivery Anes PTL Lv  3 Current           2 Term 05/04/14 [redacted]w[redacted]d 15:01 / 01:55 6 lb 8.4 oz (2.96 kg) F Vag-Spont EPI  LIV     Apgar1: 9  Apgar5: 9  1 TAB            Past Medical History:  Diagnosis Date  . Medical history non-contributory    Past Surgical History:  Procedure Laterality Date  . NO PAST SURGERIES     History reviewed. No pertinent family history. Social History   Tobacco Use  . Smoking status: Never Smoker  . Smokeless tobacco: Never Used  Substance Use Topics  . Alcohol use: No    Alcohol/week: 0.0 standard drinks  . Drug use: No   No Known Allergies Current Outpatient Medications on File Prior to Visit  Medication Sig Dispense Refill  . Prenatal Vit-Fe Fumarate-FA (PRENATAL VITAMIN) 27-0.8 MG TABS Take by mouth.     No current facility-administered medications on file prior to visit.      Exam   Vitals:   01/20/19 1411  BP: 116/73  Pulse: 96  Weight: 153 lb 12.8 oz (69.8 kg)   Fetal Heart Rate (bpm): 155  Uterus:  Fundal Height: 12 cm  Pelvic Exam: Perineum: no hemorrhoids, normal perineum   Vulva: normal external genitalia, no lesions   Vagina:  normal mucosa, normal discharge   Cervix: no lesions and normal, pap smear done.    Adnexa: normal adnexa and no mass, fullness, tenderness   Bony Pelvis: average  System: General: well-developed, well-nourished female in no acute distress   Breast:  normal appearance, no masses or tenderness   Skin: normal  coloration and turgor, no rashes   Neurologic: oriented, normal, negative, normal mood   Extremities: normal strength, tone, and muscle mass, ROM of all joints is normal   HEENT extraocular movement intact and sclera clear, anicteric   Mouth/Teeth mask on - reports cavity and will see dentist   Neck supple and no masses, normal thyroid   Cardiovascular: regular rate and rhythm   Respiratory:  no respiratory distress, normal breath sounds   Abdomen: soft, non-tender; no masses,  no organomegaly     Assessment:   Pregnancy: 03/22/19 Patient Active Problem List   Diagnosis Date Noted  . Encounter for supervision of normal pregnancy, unspecified, unspecified trimester 01/20/2019  . Encounter for postpartum visit 05/25/2014     Plan:  1. Encounter for supervision of normal pregnancy, antepartum, unspecified gravidity  - Cytology - PAP( [facility]) - Cervicovaginal ancillary only( [facility]) - Culture, OB Urine - Obstetric Panel, Including HIV - Genetic Screening - Babyscripts Schedule Optimization - Enroll Patient in Babyscripts Declined flu vaccine  2. Ptyalism Has plastic water bottle half full of saliva Had in a previous pregnancy but this time is worse Ordered robinul   Initial labs drawn. Continue prenatal vitamins. Genetic  Screening discussed, panorama-horizon: ordered. Ultrasound discussed; fetal anatomic survey: will order at next visit. Problem list reviewed and updated. The nature of [facility] with multiple MDs and other Advanced Practice Providers was explained to patient; also emphasized that residents, students are part of our team. Routine obstetric precautions reviewed. Return in about 4 weeks (around 02/17/2019) for virtual ROB.  Total face-to-face time with patient: 40 minutes.  Over 50% of encounter was spent on counseling and coordination of care.     Earlie Server, FNP Family Nurse Practitioner, [facility] for [employer], [facility] 01/20/2019 4:16 PM

## 2019-01-20 NOTE — Patient Instructions (Signed)
Safe Medications in Pregnancy  ° °Acne: °Benzoyl Peroxide °Salicylic Acid ° °Backache/Headache: °Tylenol: 2 regular strength every 4 hours OR °             2 Extra strength every 6 hours ° °Colds/Coughs/Allergies: °Benadryl (alcohol free) 25 mg every 6 hours as needed °Breath right strips °Claritin °Cepacol throat lozenges °Chloraseptic throat spray °Cold-Eeze- up to three times per day °Cough drops, alcohol free °Flonase  °Guaifenesin °Mucinex °Robitussin DM (plain only, alcohol free) °Saline nasal spray/drops °Sudafed (pseudoephedrine) & Actifed ** use only after [redacted] weeks gestation and if you do not have high blood pressure °Tylenol °Vicks Vaporub °Zinc lozenges °Zyrtec  ° °Constipation: °Colace °Ducolax suppositories °Fleet enema °Glycerin suppositories °Metamucil °Milk of magnesia °Miralax °Senokot °Smooth move tea ° °Diarrhea: °Kaopectate °Imodium A-D ° °*NO pepto Bismol ° °Hemorrhoids: °Anusol °Anusol HC °Preparation H °Tucks ° °Indigestion: °Tums °Maalox °Mylanta °Zantac  °Pepcid ° °Insomnia: °Benadryl (alcohol free) 25mg every 6 hours as needed °Tylenol PM °Unisom, no Gelcaps ° °Leg Cramps: °Tums °MagGel ° °Nausea/Vomiting:  °Bonine °Dramamine °Emetrol °Ginger extract °Sea bands °Meclizine  °Nausea medication to take during pregnancy:  °Unisom (doxylamine succinate 25 mg tablets) Take one tablet daily at bedtime. If symptoms are not adequately controlled, the dose can be increased to a maximum recommended dose of two tablets daily (1/2 tablet in the morning, 1/2 tablet mid-afternoon and one at bedtime). °Vitamin B6 100mg tablets. Take one tablet twice a day (up to 200 mg per day). ° °Skin Rashes: °Aveeno products °Benadryl cream or 25mg every 6 hours as needed °Calamine Lotion °1% cortisone cream ° °Yeast infection: °Gyne-lotrimin 7 °Monistat 7 ° ° °**If taking multiple medications, please check labels to avoid duplicating the same active ingredients °**take medication as directed on the label °** Do not  exceed 4000 mg of tylenol in 24 hours °**Do not take medications that contain aspirin or ibuprofen ° °  °

## 2019-01-21 ENCOUNTER — Encounter: Payer: Self-pay | Admitting: Nurse Practitioner

## 2019-01-21 DIAGNOSIS — O361990 Maternal care for other isoimmunization, unspecified trimester, not applicable or unspecified: Secondary | ICD-10-CM

## 2019-01-21 HISTORY — DX: Maternal care for other isoimmunization, unspecified trimester, not applicable or unspecified: O36.1990

## 2019-01-21 LAB — OBSTETRIC PANEL, INCLUDING HIV
Basophils Absolute: 0.1 10*3/uL (ref 0.0–0.2)
Basos: 1 %
EOS (ABSOLUTE): 0.6 10*3/uL — ABNORMAL HIGH (ref 0.0–0.4)
Eos: 8 %
HIV Screen 4th Generation wRfx: NONREACTIVE
Hematocrit: 38 % (ref 34.0–46.6)
Hemoglobin: 12.7 g/dL (ref 11.1–15.9)
Hepatitis B Surface Ag: NEGATIVE
Immature Grans (Abs): 0 10*3/uL (ref 0.0–0.1)
Immature Granulocytes: 0 %
Lymphocytes Absolute: 1.5 10*3/uL (ref 0.7–3.1)
Lymphs: 20 %
MCH: 30.9 pg (ref 26.6–33.0)
MCHC: 33.4 g/dL (ref 31.5–35.7)
MCV: 93 fL (ref 79–97)
Monocytes Absolute: 0.6 10*3/uL (ref 0.1–0.9)
Monocytes: 8 %
Neutrophils Absolute: 4.9 10*3/uL (ref 1.4–7.0)
Neutrophils: 63 %
Platelets: 173 10*3/uL (ref 150–450)
RBC: 4.11 x10E6/uL (ref 3.77–5.28)
RDW: 12.7 % (ref 11.7–15.4)
RPR Ser Ql: NONREACTIVE
Rh Factor: POSITIVE
Rubella Antibodies, IGG: 4.62 index (ref >0.99)
WBC: 7.7 10*3/uL (ref 3.4–10.8)

## 2019-01-21 LAB — AB SCR+ANTIBODY ID: Antibody Screen: POSITIVE — AB

## 2019-01-23 DIAGNOSIS — O2341 Unspecified infection of urinary tract in pregnancy, first trimester: Secondary | ICD-10-CM | POA: Insufficient documentation

## 2019-01-23 LAB — URINE CULTURE, OB REFLEX

## 2019-01-23 LAB — CULTURE, OB URINE

## 2019-01-23 MED ORDER — CEPHALEXIN 500 MG PO CAPS: 500.0000 mg | ORAL_CAPSULE | Freq: Four times a day (QID) | ORAL | 0 refills | Status: AC

## 2019-01-23 NOTE — Progress Notes (Signed)
UTI noted on urine culture.  Rx for Keflex sent to her pharmacy.  Message sent to clinical staff at [facility] to call client as MyChart is not active.  Earlie Server, RN, MSN, NP-BC Nurse Practitioner, [facility] for [employer], [facility] 01/23/2019 6:31 PM

## 2019-01-23 NOTE — Addendum Note (Signed)
Addended by: Virginia Rochester on: 01/23/2019 06:33 PM   Modules accepted: Orders

## 2019-01-24 ENCOUNTER — Telehealth: Payer: Self-pay

## 2019-01-24 NOTE — Telephone Encounter (Signed)
Unable to leave message as "voicemailbox is full". Kathrene Alu RN

## 2019-01-24 NOTE — Telephone Encounter (Signed)
-----   Message from Virginia Rochester, NP sent at 01/23/2019  6:33 PM EDT ----- UTI noted. Keflex sent to her pharmacy.  Client does not have MyChart.  Message sent to clinical staff at [facility] to call client.

## 2019-01-24 NOTE — Telephone Encounter (Signed)
Patient called back and verified by name and dob. Patient made aware that keflex has been sent in for her to take. Patient made aware she should take the whole regimen. Patient states understanding. Kathrene Alu RN

## 2019-01-27 ENCOUNTER — Encounter: Payer: Self-pay | Admitting: Advanced Practice Midwife

## 2019-01-31 ENCOUNTER — Other Ambulatory Visit: Payer: Self-pay

## 2019-01-31 LAB — CERVICOVAGINAL ANCILLARY ONLY
Bacterial Vaginitis (gardnerella): NEGATIVE
Candida Glabrata: NEGATIVE
Candida Vaginitis: NEGATIVE
Chlamydia: NEGATIVE
Comment: NEGATIVE
Comment: NEGATIVE
Comment: NEGATIVE
Comment: NEGATIVE
Comment: NEGATIVE
Comment: NORMAL
Neisseria Gonorrhea: NEGATIVE
Trichomonas: NEGATIVE

## 2019-01-31 MED ORDER — BLOOD PRESSURE CUFF MISC: 1.0000 | Freq: Once | 0 refills | Status: AC

## 2019-02-01 ENCOUNTER — Encounter: Payer: Self-pay | Admitting: Advanced Practice Midwife

## 2019-02-01 DIAGNOSIS — Z148 Genetic carrier of other disease: Secondary | ICD-10-CM | POA: Insufficient documentation

## 2019-02-03 LAB — CYTOLOGY - PAP
Comment: NEGATIVE
Diagnosis: NEGATIVE
High risk HPV: NEGATIVE

## 2019-02-04 ENCOUNTER — Telehealth: Payer: Self-pay

## 2019-02-04 NOTE — Telephone Encounter (Signed)
Pt left vm reporting that she is constipated.  I contacted pt and advised her of safe meds per the safe pre list. Meds suggested colace, metamucil, benefiber, and milk of magnesia. Pt verbalized understanding. No further questions. -EH/RMA

## 2019-02-08 ENCOUNTER — Telehealth: Payer: Self-pay

## 2019-02-08 NOTE — Telephone Encounter (Signed)
Returned call and advised pt on how to get relief from constipation, advised to call back if symptoms not better.

## 2019-02-17 ENCOUNTER — Ambulatory Visit (INDEPENDENT_AMBULATORY_CARE_PROVIDER_SITE_OTHER): Payer: Medicaid Other

## 2019-02-17 ENCOUNTER — Other Ambulatory Visit: Payer: Self-pay

## 2019-02-17 DIAGNOSIS — K117 Disturbances of salivary secretion: Secondary | ICD-10-CM

## 2019-02-17 DIAGNOSIS — Z3A16 16 weeks gestation of pregnancy: Secondary | ICD-10-CM

## 2019-02-17 DIAGNOSIS — Z3490 Encounter for supervision of normal pregnancy, unspecified, unspecified trimester: Secondary | ICD-10-CM

## 2019-02-17 DIAGNOSIS — Z3492 Encounter for supervision of normal pregnancy, unspecified, second trimester: Secondary | ICD-10-CM

## 2019-02-17 NOTE — Progress Notes (Signed)
   TELEHEALTH OBSTETRICS PRENATAL VIRTUAL VIDEO VISIT ENCOUNTER NOTE  Provider location: [facility] for [facility] at [place]   I connected with Tonette Bihari on 02/17/19 at  2:15 PM EST by WebEx Video Encounter at home and verified that I am speaking with the correct person using two identifiers.   I discussed the limitations, risks, security and privacy concerns of performing an evaluation and management service virtually and the availability of in person appointments. I also discussed with the patient that there may be a patient responsible charge related to this service. The patient expressed understanding and agreed to proceed. Subjective:  Julie Winters is a 33 y.o. G3P1011 at [redacted]w[redacted]d being seen today for ongoing prenatal care.  She is currently monitored for the following issues for this low-risk pregnancy and has Encounter for supervision of normal pregnancy, unspecified, unspecified trimester; Ptyalism; Maternal atypical antibody; UTI in pregnancy, antepartum, first trimester; and Genetic carrier status on their problem list.  Patient reports no complaints.  Contractions: Not present. Vag. Bleeding: None.  Movement: Absent. Denies any leaking of fluid.    Patient states that she is having issues with taking the medication for her spitting.  She states it makes her throat dry and she then throws up. Patient other has no questions or concerns.  The following portions of the patient's history were reviewed and updated as appropriate: allergies, current medications, past family history, past medical history, past social history, past surgical history and problem list.   Objective:  There were no vitals filed for this visit.  Fetal Status:     Movement: Absent     General:  Alert, oriented and cooperative. Patient is in no acute distress.  Respiratory: Normal respiratory effort, no problems with respiration noted  Mental Status: Normal mood and affect. Normal behavior. Normal judgment  and thought content.  Rest of physical exam deferred due to type of encounter  Imaging: No results found.  Assessment and Plan:  Pregnancy: G3P1011 at [redacted]w[redacted]d 1. Encounter for supervision of normal pregnancy, antepartum, unspecified gravidity -Anticipatory guidance for upcoming appts. -Reviewed blood pressure and heart heart rate. -Instructed to contact office if heart rate continues to be elevated.  -[place] scheduled for 2-3 weeks. -Informed that next visit will be in 4 weeks via Virtual setting.  -Reviewed normal weight gain during pregnancy. -Reassured that no need to go out and buy a scale.  2. Ptyalism -Reviewed how dryness is side effect of Robinul. -Discussed usage of hard sugar free candy or gum for help with controlling spitting. -Informed that she does not have to take Robinul if she does not desire.   Preterm labor symptoms and general obstetric precautions including but not limited to vaginal bleeding, contractions, leaking of fluid and fetal movement were reviewed in detail with the patient. I discussed the assessment and treatment plan with the patient. The patient was provided an opportunity to ask questions and all were answered. The patient agreed with the plan and demonstrated an understanding of the instructions. The patient was advised to call back or seek an in-person office evaluation/go to MAU at [facility] for any urgent or concerning symptoms. Please refer to After Visit Summary for other counseling recommendations.   I provided 9 minutes of face-to-face time during this encounter.  Return in about 4 weeks (around 03/17/2019) for LR-ROB via Virtual Visit.  No future appointments.  Maryann Conners, [place] for [facility], [place]

## 2019-02-17 NOTE — Progress Notes (Signed)
Virtual ROB  CC : Pt states she has had a hard time taking glycopyrrolate. Pt states Rx causes her throat to be extremely dry.   Pt checked B/P While on phone.  B/P: 113/74  P:115

## 2019-03-08 ENCOUNTER — Other Ambulatory Visit: Payer: Self-pay

## 2019-03-08 ENCOUNTER — Ambulatory Visit (HOSPITAL_COMMUNITY)
Admission: RE | Admit: 2019-03-08 | Discharge: 2019-03-08 | Disposition: A | Discharge status: Home / Self Care | Payer: Medicaid Other | Source: Ambulatory Visit | Attending: Obstetrics and Gynecology | Admitting: Obstetrics and Gynecology

## 2019-03-08 DIAGNOSIS — O285 Abnormal chromosomal and genetic finding on antenatal screening of mother: Secondary | ICD-10-CM

## 2019-03-08 DIAGNOSIS — Z363 Encounter for antenatal screening for malformations: Secondary | ICD-10-CM | POA: Diagnosis present

## 2019-03-08 DIAGNOSIS — Z3A18 18 weeks gestation of pregnancy: Secondary | ICD-10-CM

## 2019-03-08 DIAGNOSIS — O361990 Maternal care for other isoimmunization, unspecified trimester, not applicable or unspecified: Secondary | ICD-10-CM

## 2019-03-08 DIAGNOSIS — Z3490 Encounter for supervision of normal pregnancy, unspecified, unspecified trimester: Secondary | ICD-10-CM

## 2019-03-08 DIAGNOSIS — O289 Unspecified abnormal findings on antenatal screening of mother: Secondary | ICD-10-CM | POA: Diagnosis not present

## 2019-03-17 ENCOUNTER — Encounter: Payer: Self-pay | Admitting: Certified Nurse Midwife

## 2019-03-17 ENCOUNTER — Ambulatory Visit (INDEPENDENT_AMBULATORY_CARE_PROVIDER_SITE_OTHER): Payer: Medicaid Other | Admitting: Certified Nurse Midwife

## 2019-03-17 VITALS — BP 115/87 | HR 78

## 2019-03-17 DIAGNOSIS — O2341 Unspecified infection of urinary tract in pregnancy, first trimester: Secondary | ICD-10-CM

## 2019-03-17 DIAGNOSIS — Z3480 Encounter for supervision of other normal pregnancy, unspecified trimester: Secondary | ICD-10-CM

## 2019-03-17 DIAGNOSIS — O361920 Maternal care for other isoimmunization, second trimester, not applicable or unspecified: Secondary | ICD-10-CM

## 2019-03-17 DIAGNOSIS — Z3A20 20 weeks gestation of pregnancy: Secondary | ICD-10-CM

## 2019-03-17 DIAGNOSIS — O2342 Unspecified infection of urinary tract in pregnancy, second trimester: Secondary | ICD-10-CM

## 2019-03-17 NOTE — Progress Notes (Signed)
Pt presents for ROB. Pt identified with 2 patient identifiers. She is [redacted]w[redacted]d today. Pt would like to discuss genetic screening results.

## 2019-03-17 NOTE — Progress Notes (Signed)
TELEHEALTH OBSTETRICS PRENATAL VIRTUAL VIDEO VISIT ENCOUNTER NOTE  Provider location: [facility] [place]   I connected with Julie PhenixAbigail Winters on 03/17/19 at  2:12 PM EST by WebEx Video Encounter at home and verified that I am speaking with the correct person using two identifiers.   I discussed the limitations, risks, security and privacy concerns of performing an evaluation and management service virtually and the availability of in person appointments. I also discussed with the patient that there may be a patient responsible charge related to this service. The patient expressed understanding and agreed to proceed. Subjective:  Julie Winters is a 33 y.o. G3P1011 at 4933w0d being seen today for ongoing prenatal care.  She is currently monitored for the following issues for this low-risk pregnancy and has Encounter for supervision of normal pregnancy, unspecified, unspecified trimester; Ptyalism; Maternal atypical antibody; UTI in pregnancy, antepartum, first trimester; and Genetic carrier status on their problem list.  Patient reports no complaints.  Contractions: Irritability. Vag. Bleeding: None.  Movement: Present. Denies any leaking of fluid.   The following portions of the patient's history were reviewed and updated as appropriate: allergies, current medications, past family history, past medical history, past social history, past surgical history and problem list.   Objective:   Vitals:   03/17/19 1400  BP: 115/87  Pulse: 78    Fetal Status:     Movement: Present     General:  Alert, oriented and cooperative. Patient is in no acute distress.  Respiratory: Normal respiratory effort, no problems with respiration noted  Mental Status: Normal mood and affect. Normal behavior. Normal judgment and thought content.  Rest of physical exam deferred due to type of encounter  Imaging: Koreas Mfm Ob Detail +14 Wk  Result Date: 03/08/2019  ----------------------------------------------------------------------  OBSTETRICS REPORT                       (Signed Final 03/08/2019 04:38 pm) ---------------------------------------------------------------------- Patient Info  ID #:       [id number]                          D.O.B.:  01/06/1986 (33 yrs)  Name:       Julie PhenixABIGAIL Winters                   Visit Date: 03/08/2019 03:01 pm ---------------------------------------------------------------------- Performed By  Performed By:     Tommie RaymondMesha Tester BS,       Ref. Address:     [address] [place]                    [address]                                                             [address]                                                             [address] [phone]  [place]                                                             [place]  Attending:        Tama High MD        [facility]  Referred By:      [place] ---------------------------------------------------------------------- Orders   #  Description                          Code         Ordered By   1  [place] MFM OB DETAIL +14 [place]              76811.01     [facility]  ----------------------------------------------------------------------   #  Order #                    Accession #                 Episode #   1  [id number]                  [id number]                  [id number]  ---------------------------------------------------------------------- Indications   [redacted] weeks gestation of pregnancy                Z3A.18   Encounter for antenatal screening for          Z36.3   malformations   Genetic carrier (Spinal Muscular Atrophy)      Z14.8   Abnormal biochemical screen (+ Antibodies-     O28.9   unspecified)  ---------------------------------------------------------------------- Fetal Evaluation  Num Of Fetuses:         1  Fetal Heart Rate(bpm):  151  Cardiac  Activity:       Observed  Presentation:           Cephalic  Placenta:               Anterior Fundal  P. Cord Insertion:      Visualized, central  Amniotic Fluid  AFI FV:      Within normal limits                              Largest Pocket(cm)                              4.02 ---------------------------------------------------------------------- Biometry  BPD:      41.7  mm     G. Age:  18w 4d         47  %    CI:        79.91   %  70 - 86                                                          FL/HC:      20.1   %    16.1 - 18.3  HC:      147.4  mm     G. Age:  17w 6d         10  %    HC/AC:      1.09        1.09 - 1.39  AC:      135.5  mm     G. Age:  19w 0d         56  %    FL/BPD:     71.2   %  FL:       29.7  mm     G. Age:  19w 1d         60  %    FL/AC:      21.9   %    20 - 24  HUM:      28.6  mm     G. Age:  19w 2d         66  %  CER:      18.5  mm     G. Age:  18w 2d         35  %  NFT:       4.6  mm  LV:        7.7  mm  CM:        3.3  mm  Est. FW:     266  gm      0 lb 9 oz     60  % ---------------------------------------------------------------------- OB History  Gravidity:    3         Term:   1        Prem:   0        SAB:   0  TOP:          1       Ectopic:  0        Living: 1 ---------------------------------------------------------------------- Gestational Age  LMP:           18w 5d        Date:  10/28/18                 EDD:   08/04/19  U/S Today:     18w 5d                                        EDD:   08/04/19  Best:          18w 5d     Det. By:  LMP  (10/28/18)          EDD:   08/04/19 ---------------------------------------------------------------------- Anatomy  Cranium:               Appears normal         Aortic Arch:            Appears normal  Cavum:  Appears normal         Ductal Arch:            Appears normal  Ventricles:            Appears normal         Diaphragm:              Appears normal  Choroid Plexus:        Appears normal         Stomach:                 Appears normal, left                                                                        sided  Cerebellum:            Appears normal         Abdomen:                Appears normal  Posterior Fossa:       Appears normal         Abdominal Wall:         Appears nml (cord                                                                        insert, abd wall)  Nuchal Fold:           Appears normal         Cord Vessels:           Appears normal (3                                                                        vessel cord)  Face:                  Appears normal         Kidneys:                Appear normal                         (orbits and profile)  Lips:                  Appears normal         Bladder:                Appears normal  Thoracic:              Appears normal         Spine:                  Appears normal  Heart:                 Appears normal         Upper Extremities:      Appears normal                         (4CH, axis, and                         situs)  RVOT:                  Appears normal         Lower Extremities:      Appears normal  LVOT:                  Appears normal  Other:  Female gender. Heels and Right 5th digit visualized. Technically difficult          due to fetal position. ---------------------------------------------------------------------- Cervix Uterus Adnexa  Cervix  Length:           3.18  cm.  Normal appearance by transabdominal scan.  Uterus  No abnormality visualized.  Left Ovary  Within normal limits. No adnexal mass visualized.  Right Ovary  Within normal limits. No adnexal mass visualized.  Cul De Sac  No free fluid seen.  Adnexa  No abnormality visualized. ---------------------------------------------------------------------- Impression  Ms. Winters, G3 P1, is here for fetal anatomy scan.  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  On routine screening nonspecific antibodies were found.  Her  blood type is A positive.  We performed fetal anatomy  scan. No makers of  aneuploidies or fetal structural defects are seen. Fetal  biometry is consistent with her previously-established dates.  Amniotic fluid is normal and good fetal activity is seen.  Patient understands the limitations of ultrasound in detecting  fetal anomalies.  I reassured her that nonspecific antibodies are not likely to  cause adverse fetal outcomes.  I briefly discussed the genetics of SMA carrier status and  recommended genetic counseling to discuss partner  screening. ---------------------------------------------------------------------- Recommendations  -Follow-up scans as clinically indicated.  -Genetic counseling in 2 weeks.  -Screen for antibodies close to delivery. If positive,  compatible blood should be available for transfusion if  required. ----------------------------------------------------------------------                  Noralee Space, MD Electronically Signed Final Report   03/08/2019 04:38 pm ----------------------------------------------------------------------   Assessment and Plan:  Pregnancy: G3P1011 at [redacted]w[redacted]d 1. Supervision of other normal pregnancy, antepartum - Patient doing well, no complaints - Reviewed anatomy US results and genetic screening results, patient has appt with genetic counseling next week  - Routine prenatal care  - Anticipatory guidance on upcoming appointments  - Anterior placenta noted on anatomy US- reviewed with patient that fetal movement may not be as strong right now d/t anterior placenta, patient confirms that she is feeling flutters but not strong kicks- anticipatory guidance on what to expect with fetal movement over the next 2-3 weeks  2. UTI in pregnancy, antepartum, first trimester - TOC needed at next appointment   3. Maternal atypical antibody affecting pregnancy in second trimester, single or unspecified fetus - repeat antibody screening needed at next appointment   Preterm labor symptoms and general obstetric  precautions including but not limited to vaginal bleeding, contractions, leaking of fluid and fetal movement were reviewed in detail  with the patient. I discussed the assessment and treatment plan with the patient. The patient was provided an opportunity to ask questions and all were answered. The patient agreed with the plan and demonstrated an understanding of the instructions. The patient was advised to call back or seek an in-person office evaluation/go to MAU at [facility] for any urgent or concerning symptoms. Please refer to After Visit Summary for other counseling recommendations.   I provided 13 minutes of face-to-face time during this encounter.  Return in about 4 weeks (around 04/14/2019) for ROB/repeat antibody screen- in person.  Future Appointments  Date Time Provider Department Center  03/22/2019  2:30 PM [facility] GENETIC COUNSELING RM [facility]  03/22/2019  2:45 PM [facility] NURSE [facility]  04/13/2019  2:35 PM Calvert Cantor, CNM [facility] None    Sharyon Cable, CNM Center for [facility], [facility]

## 2019-03-22 ENCOUNTER — Ambulatory Visit (HOSPITAL_COMMUNITY): Payer: Medicaid Other | Attending: Obstetrics and Gynecology | Admitting: Genetic Counselor

## 2019-03-22 ENCOUNTER — Ambulatory Visit (HOSPITAL_COMMUNITY): Payer: Medicaid Other

## 2019-03-22 ENCOUNTER — Other Ambulatory Visit: Payer: Self-pay

## 2019-03-22 ENCOUNTER — Ambulatory Visit (HOSPITAL_COMMUNITY): Payer: Self-pay

## 2019-03-22 DIAGNOSIS — Z3A20 20 weeks gestation of pregnancy: Secondary | ICD-10-CM

## 2019-03-22 DIAGNOSIS — Z148 Genetic carrier of other disease: Secondary | ICD-10-CM

## 2019-03-22 DIAGNOSIS — Z315 Encounter for genetic counseling: Secondary | ICD-10-CM

## 2019-03-22 NOTE — Progress Notes (Signed)
03/22/2019  Julie Winters May 20, 1985 MRN: [id number] DOV: 03/22/2019  Julie Winters presented to the [facility] for [facility] for a genetics consultation regarding her carrier status for spinal muscular atrophy. Julie Winters came to her appointment alone due to COVID-19 visitor restrictions.   Indication for genetic counseling - Increased risk to be silent carrier for spinal muscular atrophy (SMA)  Prenatal history  Julie Winters is a G30P1011, 33 y.o. female. Her current pregnancy has completed [redacted]w[redacted]d(Estimated Date of Delivery: 08/04/19).  Ms. Julie Winters exposure to environmental toxins or chemical agents. She denied the use of alcohol, tobacco or street drugs. She reported taking prenatal vitamins. She denied significant viral illnesses, fevers, and bleeding during the course of her pregnancy. Her medical and surgical histories were noncontributory.  Family History  A three generation pedigree was drafted and reviewed. The family history is remarkable for the following:  - Julie Winters's sister has a son with speech delay. We discussed that many times, developmental delays are multifactorial in nature, occurring due to a combination of genetic and environmental factors that are difficult to identify. Developmental delays can appear to run in families; thus, there is a chance that Julie Winters's children could also experience developmental delays of some kind. She understands that they should make the pediatrician aware of any concerns she has about her children's development.  The remaining family histories were reviewed and found to be noncontributory for birth defects, intellectual disability, recurrent pregnancy loss, and known genetic conditions. Ms. Julie Winters limited information about her partner's extended family history; thus, risk assessment was limited.  The patient's ethnicity is [place] The father of the pregnancy's ethnicity is [place] Ashkenazi Jewish ancestry and  consanguinity were denied. Pedigree will be scanned under Media.  Discussion  Julie Winters was found to have 2 copies of the SMN1 gene on Horizon-14 carrier screening; however, she also has the c.*3+80T>G polymorphism of SMN1 in intron 7 (also known as [id number]>G). This puts her at increased risk (1 in 374 to be a silent 2+0 carrier for spinal muscular atrophy (SMA). SMA is a condition caused by mutations in the SMN1 gene. SMA is characterized by progressive muscle weakness and atrophy due to degeneration and loss of anterior horn cells (lower motor neurons) in the spinal cord and brain stem. We discussed the different types of SMA (0, I, II, and III), including differences in severity and age of onset. We also reviewed the autosomal recessive inheritance pattern of SMA.   We discussed that carrier testing by copy number analysis is recommended for the father of the pregnancy. Based on the carrier frequency for SMA in the African American population, he currently has a 1 in 66 chance of being a carrier of SMA. If Julie Winters's partner is found to have 2 copies of SMN1, his risk of being a carrier is reduced but not eliminated. If both parents are carriers of SMA, there is a 25% chance of having an affected fetus.   Ms. Julie Winters screening was negative for the other 13 conditions screened. Thus, her risk to be a carrier for these additional conditions (listed separately in the laboratory report) has been reduced but not eliminated. This also significantly reduces her risk of having a child affected by one of these conditions. We discussed that carrier testing for SMA is recommended for Julie Winters's partner. Ms. Julie Hickmanindicated that partner carrier screening would be challenging to coordinate, as her husband [place]. Without partner screening to refine risk and based on her partner's ethnicity, the couple currently has a 1 in [place] (0.01%) risk of having a child with  SMA.  Julie Winters was also informed about theEarly Checkresearch studyto add SMA to her baby's newborn screening panelfree of charge.The Early Check study is able to identify infants affected with SMA much earlier than clinical diagnoses of SMA are often made. Julie Winters indicated that she was interested in pursuing this, so she was given written information on how to enroll in the Early Check study.  We also reviewed that Julie Winters had Panorama NIPS through the laboratory Julie Winters that was low-risk for fetal aneuploidies. We reviewed that these results showed a less than 1 in 10,000 risk for trisomies 21, 18 and 13, and monosomy X (Turner syndrome).  In addition, the risk for triploidy and sex chromosome trisomies (47,XXX and 47,XXY) was also low. Julie Winters elected to have cfDNA analysis for 22q11.2 deletion syndrome, which was also low risk (1 in 2900). We reviewed that while this testing identifies 94-99% of pregnancies with trisomy 19, trisomy 29, sex chromosome aneuploidies, and triploidy, it is NOT diagnostic. A positive test result requires confirmation by CVS or amniocentesis, and a negative test result does not rule out a fetal chromosome abnormality. She also understands that this testing does not identify all genetic conditions.  A complete ultrasound was performed on 11/24. The ultrasound report was sent under separate cover. There were no visualized fetal anomalies or markers suggestive of aneuploidy.  Julie Winters was also counseled regarding diagnostic testing via amniocentesis. We discussed the technical aspects of the procedure and quoted up to a 1 in 500 (0.2%) risk for spontaneous pregnancy loss or other adverse pregnancy outcomes as a result of amniocentesis. Cultured cells from an amniocentesis sample allow for the visualization of a fetal karyotype, which can detect >99% of chromosomal aberrations. Chromosomal microarray can also be performed to identify smaller deletions or duplications  of fetal chromosomal material. Amniocentesis could also be performed to assess whether the baby is affected by SMA. After careful consideration, Julie Winters declined amniocentesis at this time. She understands that amniocentesis is available at any point after 16 weeks of pregnancy and that she may opt to undergo the procedure at a later date should she change her mind.  Lastly, the patient was made aware that screening for open neural tube defects (ONTDs) via MS-AFP in the second trimester in addition to level II ultrasound examination is recommended. We reviewed that Julie Winters's level II ultrasound did not detect any ONTDs, and that level II ultrasound is able to detect them with 90-95% sensitivity. However, normal results from any of the above options do not guarantee a normal baby, as 3-5% of newborns have some type of birth defect, many of which are not prenatally diagnosable.  Julie Winters indicated that carrier screening for her partner, Nautika Cressey, will likely not be possible. I informed Julie Winters that I am able to mail a saliva kit to the couple's home should her husband be present for some time in [place]. She was provided with my contact information and encouraged to reach out to me if partner carrier screening is desired at any time.  I counseled Julie Winters regarding the above risks and available options. The approximate face-to-face time with the genetic counselor was 30 minutes.  In summary:  Discussed carrier screening results and options for follow-up testing  Increased risk (1 in 34) to be silent  carrier for spinal muscular atrophy  Recommend partner carrier screening. May not be possible as partner travels back and forth between [place] and [place]. Encouraged her to contact me if she is interested in coordinating carrier screening when he is back in [place]  Given information about Early Check research study to add spinal muscular atrophy to newborn screen  Reviewed low-risk NIPS  results  Reduction in risk for Down syndrome,trisomy 18,trisomy 76, sex chromosome aneuploidies, and 22q11.2deletion syndrome  Reviewed results of ultrasound  No fetal anomalies or markers seen  Reduction in risk for fetal aneuploidy  Offered additional testing and screening  Declined amniocentesis  Recommend MS-AFP screening  Reviewed family history concerns   Buelah Manis, MS Genetic Counselor

## 2019-04-13 ENCOUNTER — Encounter: Payer: Medicaid Other | Admitting: Medical

## 2019-04-15 NOTE — L&D Delivery Note (Signed)
OB/GYN Faculty Practice Delivery Note  Julie Winters is a 34 y.o. [id number] s/p VAVD at [redacted]w[redacted]d. She was admitted for spontaneous labor.   ROM: 4h 71m with moderate meconium fluid GBS Status: Unknown   Maximum Maternal Temperature: 98.8 F    Labor Progress: . Patient arrived at 4.5 cm dilation and progressed spontaneously to 5 cm. She had a prolonged deceleration and had AROM for moderate meconium followed by terbutaline after another prolonged. She subsequently progressed spontaneously to complete and started pushing, however she had prolonged decelerations and after three pushes she was consented for a VAVD.  Delivery Date/Time: 08/01/2019 at 2053  Operative Delivery Note Infant was delivered via Vacuum Assisted Vaginal Delivery due to NRFHT.  The patient was examined and found to be Presentation: vertex; Position: Left,, Occiput,, Anterior; Station: +3.  Verbal consent: obtained from patient.  Risks and benefits discussed in detail.  Risks include, but are not limited to the risks of anesthesia, bleeding, infection, damage to maternal tissues, fetal cephalhematoma.  There is also the risk of inability to effect vaginal delivery of the head, or shoulder dystocia that cannot be resolved by established maneuvers, leading to the need for emergency cesarean section.  The Kiwi vacuum was positioned over the sagittal suture 3 cm anterior to posterior fontanelle.  Pressure was then increased to 500 mmHg, and the patient was instructed to push.  Pulling was administered along the pelvic curve.  Two pulls were administered during one contraction.  No popoffs.  The infant was then delivered atraumatically.  Head delivered in LOA position. No nuchal cord present. Shoulder and body delivered in usual fashion. Infant with spontaneous cry, placed on mother's abdomen, dried and stimulated. Cord notable for meconium staining and short length, clamped x 2 after 1-minute delay, and cut by FOB. Cord blood drawn.  Placenta delivered spontaneously with gentle cord traction. Fundus firm with massage and Pitocin. Labia, perineum, vagina, and cervix inspected with no lacerations.   Sponge, instrument and needle counts were correct x2.  Dr. Langston Masker was present at bedside throughout delivery.  Placenta: 3v intact to L&D Complications: none Lacerations: none EBL: 200 cc Analgesia: epidural   Infant: APGAR (1 MIN):  8 APGAR (5 MINS):  9  Weight: 3096 grams  Zack Seal, MD/MPH OB/GYN Fellow, Faculty Practice

## 2019-04-18 ENCOUNTER — Other Ambulatory Visit: Payer: Self-pay

## 2019-04-18 ENCOUNTER — Ambulatory Visit (INDEPENDENT_AMBULATORY_CARE_PROVIDER_SITE_OTHER): Payer: Medicaid Other | Admitting: Obstetrics and Gynecology

## 2019-04-18 ENCOUNTER — Encounter: Payer: Self-pay | Admitting: Obstetrics and Gynecology

## 2019-04-18 VITALS — BP 101/65 | HR 99 | Wt 171.8 lb

## 2019-04-18 DIAGNOSIS — Z3480 Encounter for supervision of other normal pregnancy, unspecified trimester: Secondary | ICD-10-CM

## 2019-04-18 DIAGNOSIS — O361920 Maternal care for other isoimmunization, second trimester, not applicable or unspecified: Secondary | ICD-10-CM

## 2019-04-18 DIAGNOSIS — Z3A24 24 weeks gestation of pregnancy: Secondary | ICD-10-CM

## 2019-04-18 NOTE — Progress Notes (Signed)
Pt is here for ROB, [redacted]w[redacted]d.  

## 2019-04-18 NOTE — Progress Notes (Signed)
   PRENATAL VISIT NOTE  Subjective:  Julie Winters is a 34 y.o. G3P1011 at [redacted]w[redacted]d being seen today for ongoing prenatal care.  She is currently monitored for the following issues for this low-risk pregnancy and has Encounter for supervision of normal pregnancy, unspecified, unspecified trimester; Ptyalism; Maternal atypical antibody; UTI in pregnancy, antepartum, first trimester; and Genetic carrier status on their problem list.  Patient reports no complaints.  Contractions: Not present. Vag. Bleeding: None.  Movement: Present. Denies leaking of fluid.   The following portions of the patient's history were reviewed and updated as appropriate: allergies, current medications, past family history, past medical history, past social history, past surgical history and problem list.   Objective:   Vitals:   04/18/19 1304  BP: 101/65  Pulse: 99  Weight: 171 lb 12.8 oz (77.9 kg)    Fetal Status: Fetal Heart Rate (bpm): 155 Fundal Height: 25 cm Movement: Present     General:  Alert, oriented and cooperative. Patient is in no acute distress.  Skin: Skin is warm and dry. No rash noted.   Cardiovascular: Normal heart rate noted  Respiratory: Normal respiratory effort, no problems with respiration noted  Abdomen: Soft, gravid, appropriate for gestational age.  Pain/Pressure: Absent     Pelvic: Cervical exam deferred        Extremities: Normal range of motion.  Edema: None  Mental Status: Normal mood and affect. Normal behavior. Normal judgment and thought content.   Assessment and Plan:  Pregnancy: G3P1011 at [redacted]w[redacted]d 1. Supervision of other normal pregnancy, antepartum Patient is doing well without complaints Third trimester labs next visit Patient is considering Nexplanon for contraception  2. Maternal atypical antibody affecting pregnancy in second trimester, single or unspecified fetus Repeat antibody screen done today  Preterm labor symptoms and general obstetric precautions including but  not limited to vaginal bleeding, contractions, leaking of fluid and fetal movement were reviewed in detail with the patient. Please refer to After Visit Summary for other counseling recommendations.   Return in about 3 weeks (around 05/09/2019) for in person, ROB, Low risk, 2 hr glucola next visit.  No future appointments.  Catalina Antigua, MD

## 2019-04-20 LAB — AB SCR+ANTIBODY ID: Antibody Screen: POSITIVE — AB

## 2019-04-20 LAB — ANTIBODY SCREEN

## 2019-04-21 LAB — CULTURE, OB URINE

## 2019-04-21 LAB — URINE CULTURE, OB REFLEX

## 2019-05-09 ENCOUNTER — Other Ambulatory Visit: Payer: Self-pay

## 2019-05-09 ENCOUNTER — Other Ambulatory Visit: Payer: Medicaid Other

## 2019-05-09 ENCOUNTER — Ambulatory Visit (INDEPENDENT_AMBULATORY_CARE_PROVIDER_SITE_OTHER): Payer: Medicaid Other | Admitting: Advanced Practice Midwife

## 2019-05-09 VITALS — BP 112/71 | HR 90 | Wt 176.2 lb

## 2019-05-09 DIAGNOSIS — Z3A27 27 weeks gestation of pregnancy: Secondary | ICD-10-CM

## 2019-05-09 DIAGNOSIS — O2342 Unspecified infection of urinary tract in pregnancy, second trimester: Secondary | ICD-10-CM

## 2019-05-09 DIAGNOSIS — K5900 Constipation, unspecified: Secondary | ICD-10-CM

## 2019-05-09 DIAGNOSIS — Z3480 Encounter for supervision of other normal pregnancy, unspecified trimester: Secondary | ICD-10-CM

## 2019-05-09 DIAGNOSIS — O99612 Diseases of the digestive system complicating pregnancy, second trimester: Secondary | ICD-10-CM

## 2019-05-09 DIAGNOSIS — O2341 Unspecified infection of urinary tract in pregnancy, first trimester: Secondary | ICD-10-CM

## 2019-05-09 NOTE — Progress Notes (Signed)
Pt is here for ROB and 2 hr GTT. [redacted]w[redacted]d.

## 2019-05-09 NOTE — Progress Notes (Signed)
   PRENATAL VISIT NOTE  Subjective:  Julie Winters is a 34 y.o. G3P1011 at [redacted]w[redacted]d being seen today for ongoing prenatal care.  She is currently monitored for the following issues for this low-risk pregnancy and has Encounter for supervision of normal pregnancy, unspecified, unspecified trimester; Ptyalism; Maternal atypical antibody; UTI in pregnancy, antepartum, first trimester; and Genetic carrier status on their problem list.  Patient reports constipation. She has tried milk of magnesium with some success but is interested in other strategies/medications she can use.  Contractions: Not present. Vag. Bleeding: one episode of blood when wiping, no further incidents.  Movement: Present. Denies leaking of fluid.   The following portions of the patient's history were reviewed and updated as appropriate: allergies, current medications, past family history, past medical history, past social history, past surgical history and problem list.   Objective:   Vitals:   05/09/19 1000  BP: 112/71  Pulse: 90  Weight: 176 lb 3.2 oz (79.9 kg)    Fetal Status: Fetal Heart Rate (bpm): 140   Movement: Present     General:  Alert, oriented and cooperative. Patient is in no acute distress.  Skin: Skin is warm and dry. No rash noted.   Cardiovascular: Normal heart rate noted  Respiratory: Normal respiratory effort, no problems with respiration noted  Abdomen: Soft, gravid, appropriate for gestational age.  Pain/Pressure: Absent     Pelvic: Cervical exam deferred        Extremities: Normal range of motion.  Edema: None  Mental Status: Normal mood and affect. Normal behavior. Normal judgment and thought content.   Assessment and Plan:  Pregnancy: G3P1011 at [redacted]w[redacted]d 1. Supervision of other normal pregnancy, antepartum - Glucose Tolerance, 2 Hours w/1 Hour - CBC, RPR, HIV Antibody (routine testing w/ rflx)  - Reviewed second positive antibody test, no action required at this time. MFM recommends repeat  testing close to delivery and if positive, compatible blood should be made available for transfusion - Anticipatory guidance on upcoming appointments   2. UTI in pregnancy, antepartum, first trimester - Negative test of cure (04/18/2019)   3. Constipation during pregnancy in second trimester - Patient counseled on increased hydration,high fiber diet and colace/miralax/milk of magnesium   Preterm labor symptoms and general obstetric precautions including but not limited to vaginal bleeding, contractions, leaking of fluid and fetal movement were reviewed in detail with the patient. Please refer to After Visit Summary for other counseling recommendations.    Return in about 4 weeks (around 06/06/2019).  No future appointments.  Georgiana Spinner, Medical Student

## 2019-05-09 NOTE — Patient Instructions (Signed)

## 2019-05-10 LAB — RPR: RPR Ser Ql: NONREACTIVE

## 2019-05-10 LAB — CBC
Hematocrit: 31.2 % — ABNORMAL LOW (ref 34.0–46.6)
Hemoglobin: 10.5 g/dL — ABNORMAL LOW (ref 11.1–15.9)
MCH: 30.5 pg (ref 26.6–33.0)
MCHC: 33.7 g/dL (ref 31.5–35.7)
MCV: 91 fL (ref 79–97)
Platelets: 133 10*3/uL — ABNORMAL LOW (ref 150–450)
RBC: 3.44 x10E6/uL — ABNORMAL LOW (ref 3.77–5.28)
RDW: 13 % (ref 11.7–15.4)
WBC: 7.2 10*3/uL (ref 3.4–10.8)

## 2019-05-10 LAB — GLUCOSE TOLERANCE, 2 HOURS W/ 1HR
Glucose, 1 hour: 53 mg/dL — ABNORMAL LOW (ref 65–179)
Glucose, 2 hour: 94 mg/dL (ref 65–152)
Glucose, Fasting: 71 mg/dL (ref 65–91)

## 2019-05-10 LAB — HIV ANTIBODY (ROUTINE TESTING W REFLEX): HIV Screen 4th Generation wRfx: NONREACTIVE

## 2019-06-06 ENCOUNTER — Encounter: Payer: Self-pay | Admitting: Nurse Practitioner

## 2019-06-06 ENCOUNTER — Telehealth (INDEPENDENT_AMBULATORY_CARE_PROVIDER_SITE_OTHER): Payer: Medicaid Other | Admitting: Nurse Practitioner

## 2019-06-06 DIAGNOSIS — Z3480 Encounter for supervision of other normal pregnancy, unspecified trimester: Secondary | ICD-10-CM

## 2019-06-06 NOTE — Patient Instructions (Signed)
Sign up for childbirth classes at: [email or web address]

## 2019-06-06 NOTE — Progress Notes (Signed)
I connected with@ on 06/06/19 at  1:00 PM EST by: MyChart and verified that I am speaking with the correct person using two identifiers.  Patient is located at home and provider is located at [facility].     The purpose of this virtual visit is to provide medical care while limiting exposure to the novel coronavirus. I discussed the limitations, risks, security and privacy concerns of performing an evaluation and management service by Mychart and the availability of in person appointments. I also discussed with the patient that there may be a patient responsible charge related to this service. By engaging in this virtual visit, you consent to the provision of healthcare.  Additionally, you authorize for your insurance to be billed for the services provided during this visit.  The patient expressed understanding and agreed to proceed.  The following staff members participated in the virtual visit:  Corky Crafts, CMA and Nolene Bernheim, NP    PRENATAL VISIT NOTE  Subjective:  Julie Winters is a 34 y.o. G3P1011 at [redacted]w[redacted]d  for phone visit for ongoing prenatal care.  She is currently monitored for the following issues for this low-risk pregnancy and has Encounter for supervision of normal pregnancy, unspecified, unspecified trimester; Ptyalism; Maternal atypical antibody; UTI in pregnancy, antepartum, first trimester; and Genetic carrier status on their problem list.  Patient reports leg cramps at night.  Contractions: Irritability. Vag. Bleeding: None.  Movement: Present. Denies leaking of fluid.   The following portions of the patient's history were reviewed and updated as appropriate: allergies, current medications, past family history, past medical history, past social history, past surgical history and problem list.   Objective:   Vitals:   06/06/19 1306  BP: 101/73  Pulse: (!) 102   Self-Obtained  Fetal Status:     Movement: Present     Assessment and Plan:  Pregnancy: G3P1011 at [redacted]w[redacted]d 1.  Supervision of other normal pregnancy, antepartum Used epidural for her last birth.  Advised childbirth classes this time but client does not have MyChart app and does not understand babyscripts well. Has BP cuff and advised to take BP weekly.  Cannot add to babyscripts so advised to call the office if BP is equal to or more than 140/90 (either value). Baby is moving well. Having some leg cramps at night and hips hurt at night.  Advised stretching exercises and weighbearing on affected leg when cramps occur.  Preterm labor symptoms and general obstetric precautions including but not limited to vaginal bleeding, contractions, leaking of fluid and fetal movement were reviewed in detail with the patient.  Return in about 2 weeks (around 06/20/2019) for virtual ROB.  No future appointments.   Time spent on virtual visit: 6 minutes  Currie Paris, NP

## 2019-06-06 NOTE — Progress Notes (Signed)
Virtual ROB with no complaints  Pt able to check B/P while on the phone   101/73  102

## 2019-06-20 ENCOUNTER — Encounter: Payer: Self-pay | Admitting: Obstetrics

## 2019-06-20 ENCOUNTER — Telehealth (INDEPENDENT_AMBULATORY_CARE_PROVIDER_SITE_OTHER): Payer: Medicaid Other | Admitting: Obstetrics

## 2019-06-20 DIAGNOSIS — Z3480 Encounter for supervision of other normal pregnancy, unspecified trimester: Secondary | ICD-10-CM

## 2019-06-20 DIAGNOSIS — O361930 Maternal care for other isoimmunization, third trimester, not applicable or unspecified: Secondary | ICD-10-CM

## 2019-06-20 DIAGNOSIS — L299 Pruritus, unspecified: Secondary | ICD-10-CM

## 2019-06-20 DIAGNOSIS — Z3A33 33 weeks gestation of pregnancy: Secondary | ICD-10-CM

## 2019-06-20 DIAGNOSIS — O99719 Diseases of the skin and subcutaneous tissue complicating pregnancy, unspecified trimester: Secondary | ICD-10-CM

## 2019-06-20 DIAGNOSIS — O99713 Diseases of the skin and subcutaneous tissue complicating pregnancy, third trimester: Secondary | ICD-10-CM

## 2019-06-20 NOTE — Progress Notes (Signed)
Pt states she has been experiencing full body itching- pt states notices more after she showers.

## 2019-06-20 NOTE — Progress Notes (Signed)
   TELEHEALTH OBSTETRICS PRENATAL VIRTUAL VIDEO VISIT ENCOUNTER NOTE  Provider location: Center for [employer] at [place]   I connected with Julie Winters on 06/21/19 at  2:00 PM EST by OB MyChart Video Encounter at home and verified that I am speaking with the correct person using two identifiers.   I discussed the limitations, risks, security and privacy concerns of performing an evaluation and management service virtually and the availability of in person appointments. I also discussed with the patient that there may be a patient responsible charge related to this service. The patient expressed understanding and agreed to proceed. Subjective:  Julie Winters is a 34 y.o. G3P1011 at [redacted]w[redacted]d being seen today for ongoing prenatal care.  She is currently monitored for the following issues for this low-risk pregnancy and has Encounter for supervision of normal pregnancy, unspecified, unspecified trimester; Ptyalism; Maternal atypical antibody; UTI in pregnancy, antepartum, first trimester; and Genetic carrier status on their problem list.  Patient reports severe itching all over.  Contractions: Irregular. Vag. Bleeding: None.  Movement: Present. Denies any leaking of fluid.   The following portions of the patient's history were reviewed and updated as appropriate: allergies, current medications, past family history, past medical history, past social history, past surgical history and problem list.   Objective:  There were no vitals filed for this visit.  Fetal Status:     Movement: Present     General:  Alert, oriented and cooperative. Patient is in no acute distress.  Respiratory: Normal respiratory effort, no problems with respiration noted  Mental Status: Normal mood and affect. Normal behavior. Normal judgment and thought content.  Rest of physical exam deferred due to type of encounter  Imaging: No results found.  Assessment and Plan:  Pregnancy: G3P1011 at [redacted]w[redacted]d 1. Supervision  of other normal pregnancy, antepartum  2. Maternal atypical antibody affecting pregnancy in third trimester, single or unspecified fetus Rx: - Antibody screen; Future  3. Pruritus of pregnancy, antepartum Rx: - Comprehensive metabolic panel; Future - Bile acids, total; Future  Preterm labor symptoms and general obstetric precautions including but not limited to vaginal bleeding, contractions, leaking of fluid and fetal movement were reviewed in detail with the patient. I discussed the assessment and treatment plan with the patient. The patient was provided an opportunity to ask questions and all were answered. The patient agreed with the plan and demonstrated an understanding of the instructions. The patient was advised to call back or seek an in-person office evaluation/go to MAU at [facility] for any urgent or concerning symptoms. Please refer to After Visit Summary for other counseling recommendations.   I provided 10 minutes of face-to-face time during this encounter.  Return in about 2 weeks (around 07/04/2019) for MyChart.  Future Appointments  Date Time Provider Department Center  06/23/2019  1:00 PM [facility] None    Coral Ceo, MD Center for [facility], [facility] 06/21/2019

## 2019-06-21 ENCOUNTER — Encounter: Payer: Self-pay | Admitting: Obstetrics

## 2019-06-23 ENCOUNTER — Other Ambulatory Visit: Payer: Medicaid Other

## 2019-06-23 ENCOUNTER — Other Ambulatory Visit: Payer: Self-pay

## 2019-06-23 DIAGNOSIS — O361930 Maternal care for other isoimmunization, third trimester, not applicable or unspecified: Secondary | ICD-10-CM

## 2019-06-23 DIAGNOSIS — O99719 Diseases of the skin and subcutaneous tissue complicating pregnancy, unspecified trimester: Secondary | ICD-10-CM

## 2019-06-23 DIAGNOSIS — L299 Pruritus, unspecified: Secondary | ICD-10-CM

## 2019-06-25 LAB — COMPREHENSIVE METABOLIC PANEL
ALT: 16 IU/L (ref 0–32)
AST: 21 IU/L (ref 0–40)
Albumin/Globulin Ratio: 1.6 (ref 1.2–2.2)
Albumin: 3.6 g/dL — ABNORMAL LOW (ref 3.8–4.8)
Alkaline Phosphatase: 137 IU/L — ABNORMAL HIGH (ref 39–117)
BUN/Creatinine Ratio: 9 (ref 9–23)
BUN: 5 mg/dL — ABNORMAL LOW (ref 6–20)
Bilirubin Total: 0.3 mg/dL (ref 0.0–1.2)
CO2: 20 mmol/L (ref 20–29)
Calcium: 8.8 mg/dL (ref 8.7–10.2)
Chloride: 103 mmol/L (ref 96–106)
Creatinine, Ser: 0.55 mg/dL — ABNORMAL LOW (ref 0.57–1.00)
GFR calc Af Amer: 144 mL/min/{1.73_m2} (ref >59)
GFR calc non Af Amer: 125 mL/min/{1.73_m2} (ref >59)
Globulin, Total: 2.3 g/dL (ref 1.5–4.5)
Glucose: 85 mg/dL (ref 65–99)
Potassium: 3.7 mmol/L (ref 3.5–5.2)
Sodium: 136 mmol/L (ref 134–144)
Total Protein: 5.9 g/dL — ABNORMAL LOW (ref 6.0–8.5)

## 2019-06-25 LAB — ANTIBODY SCREEN: Antibody Screen: NEGATIVE

## 2019-06-25 LAB — BILE ACIDS, TOTAL: Bile Acids Total: 6.8 umol/L (ref 0.0–10.0)

## 2019-08-01 ENCOUNTER — Inpatient Hospital Stay (HOSPITAL_COMMUNITY): Payer: Medicaid Other | Admitting: Anesthesiology

## 2019-08-01 ENCOUNTER — Encounter (HOSPITAL_COMMUNITY): Payer: Self-pay | Admitting: Family Medicine

## 2019-08-01 ENCOUNTER — Inpatient Hospital Stay (HOSPITAL_COMMUNITY)
Admission: AD | Admit: 2019-08-01 | Discharge: 2019-08-03 | DRG: 807 | Disposition: A | Discharge status: Home / Self Care | Payer: Medicaid Other | Attending: Obstetrics and Gynecology | Admitting: Obstetrics and Gynecology

## 2019-08-01 ENCOUNTER — Other Ambulatory Visit: Payer: Self-pay

## 2019-08-01 DIAGNOSIS — O368130 Decreased fetal movements, third trimester, not applicable or unspecified: Principal | ICD-10-CM | POA: Diagnosis present

## 2019-08-01 DIAGNOSIS — Z3490 Encounter for supervision of normal pregnancy, unspecified, unspecified trimester: Secondary | ICD-10-CM

## 2019-08-01 DIAGNOSIS — Z20822 Contact with and (suspected) exposure to covid-19: Secondary | ICD-10-CM | POA: Diagnosis present

## 2019-08-01 DIAGNOSIS — Z3A39 39 weeks gestation of pregnancy: Secondary | ICD-10-CM | POA: Diagnosis not present

## 2019-08-01 DIAGNOSIS — O361990 Maternal care for other isoimmunization, unspecified trimester, not applicable or unspecified: Secondary | ICD-10-CM | POA: Diagnosis present

## 2019-08-01 LAB — TYPE AND SCREEN
ABO/RH(D): A POS
Antibody Screen: NEGATIVE

## 2019-08-01 LAB — CBC
HCT: 39 % (ref 36.0–46.0)
Hemoglobin: 12.5 g/dL (ref 12.0–15.0)
MCH: 30.6 pg (ref 26.0–34.0)
MCHC: 32.1 g/dL (ref 30.0–36.0)
MCV: 95.4 fL (ref 80.0–100.0)
Platelets: 160 10*3/uL (ref 150–400)
RBC: 4.09 MIL/uL (ref 3.87–5.11)
RDW: 16.1 % — ABNORMAL HIGH (ref 11.5–15.5)
WBC: 10 10*3/uL (ref 4.0–10.5)
nRBC: 0 % (ref 0.0–0.2)

## 2019-08-01 LAB — ABO/RH: ABO/RH(D): A POS

## 2019-08-01 LAB — RESPIRATORY PANEL BY RT PCR (FLU A&B, COVID)
Influenza A by PCR: NEGATIVE
Influenza B by PCR: NEGATIVE
SARS Coronavirus 2 by RT PCR: NEGATIVE

## 2019-08-01 MED ORDER — FENTANYL-BUPIVACAINE-NACL 0.5-0.125-0.9 MG/250ML-% EP SOLN
12.0000 mL/h | EPIDURAL | Status: DC | PRN
  Filled 2019-08-01: qty 250

## 2019-08-01 MED ORDER — SODIUM CHLORIDE (PF) 0.9 % IJ SOLN
INTRAMUSCULAR | Status: DC | PRN
  Administered 2019-08-01: 12 mL/h via EPIDURAL

## 2019-08-01 MED ORDER — LACTATED RINGERS IV SOLN
500.0000 mL | Freq: Once | INTRAVENOUS | Status: AC
  Administered 2019-08-01: 500 mL via INTRAVENOUS

## 2019-08-01 MED ORDER — PHENYLEPHRINE 40 MCG/ML (10ML) SYRINGE FOR IV PUSH (FOR BLOOD PRESSURE SUPPORT)
80.0000 ug | PREFILLED_SYRINGE | INTRAVENOUS | Status: DC | PRN
  Filled 2019-08-01: qty 10

## 2019-08-01 MED ORDER — EPHEDRINE 5 MG/ML INJ: 10.0000 mg | INTRAVENOUS | Status: DC | PRN

## 2019-08-01 MED ORDER — TERBUTALINE SULFATE 1 MG/ML IJ SOLN: 0.2500 mg | Freq: Once | INTRAMUSCULAR | Status: AC

## 2019-08-01 MED ORDER — TERBUTALINE SULFATE 1 MG/ML IJ SOLN
INTRAMUSCULAR | Status: AC
  Administered 2019-08-01: 0.25 mg via SUBCUTANEOUS
  Filled 2019-08-01: qty 1

## 2019-08-01 MED ORDER — DIPHENHYDRAMINE HCL 25 MG PO CAPS: 25.0000 mg | ORAL_CAPSULE | Freq: Four times a day (QID) | ORAL | Status: DC | PRN

## 2019-08-01 MED ORDER — LACTATED RINGERS AMNIOINFUSION: INTRAVENOUS | Status: DC

## 2019-08-01 MED ORDER — MAGNESIUM HYDROXIDE 400 MG/5ML PO SUSP: 30.0000 mL | ORAL | Status: DC | PRN

## 2019-08-01 MED ORDER — SIMETHICONE 80 MG PO CHEW: 80.0000 mg | CHEWABLE_TABLET | ORAL | Status: DC | PRN

## 2019-08-01 MED ORDER — SOD CITRATE-CITRIC ACID 500-334 MG/5ML PO SOLN
30.0000 mL | ORAL | Status: DC | PRN
  Administered 2019-08-01: 19:00:00 30 mL via ORAL
  Filled 2019-08-01: qty 30

## 2019-08-01 MED ORDER — ONDANSETRON HCL 4 MG/2ML IJ SOLN: 4.0000 mg | Freq: Four times a day (QID) | INTRAMUSCULAR | Status: DC | PRN

## 2019-08-01 MED ORDER — BENZOCAINE-MENTHOL 20-0.5 % EX AERO: 1.0000 "application " | INHALATION_SPRAY | CUTANEOUS | Status: DC | PRN

## 2019-08-01 MED ORDER — WITCH HAZEL-GLYCERIN EX PADS: 1.0000 "application " | MEDICATED_PAD | CUTANEOUS | Status: DC | PRN

## 2019-08-01 MED ORDER — LACTATED RINGERS IV SOLN
500.0000 mL | INTRAVENOUS | Status: DC | PRN
  Administered 2019-08-01: 20:00:00 500 mL via INTRAVENOUS

## 2019-08-01 MED ORDER — OXYCODONE HCL 5 MG PO TABS: 10.0000 mg | ORAL_TABLET | ORAL | Status: DC | PRN

## 2019-08-01 MED ORDER — ONDANSETRON HCL 4 MG PO TABS: 4.0000 mg | ORAL_TABLET | ORAL | Status: DC | PRN

## 2019-08-01 MED ORDER — OXYCODONE HCL 5 MG PO TABS: 5.0000 mg | ORAL_TABLET | ORAL | Status: DC | PRN

## 2019-08-01 MED ORDER — COCONUT OIL OIL: 1.0000 "application " | TOPICAL_OIL | Status: DC | PRN

## 2019-08-01 MED ORDER — LACTATED RINGERS IV SOLN: INTRAVENOUS | Status: DC

## 2019-08-01 MED ORDER — PHENYLEPHRINE 40 MCG/ML (10ML) SYRINGE FOR IV PUSH (FOR BLOOD PRESSURE SUPPORT): 80.0000 ug | PREFILLED_SYRINGE | INTRAVENOUS | Status: DC | PRN

## 2019-08-01 MED ORDER — OXYTOCIN 40 UNITS IN NORMAL SALINE INFUSION - SIMPLE MED
2.5000 [IU]/h | INTRAVENOUS | Status: DC
  Administered 2019-08-01: 2.5 [IU]/h via INTRAVENOUS
  Filled 2019-08-01: qty 1000

## 2019-08-01 MED ORDER — OXYTOCIN BOLUS FROM INFUSION
500.0000 mL | Freq: Once | INTRAVENOUS | Status: AC
  Administered 2019-08-01: 21:00:00 500 mL via INTRAVENOUS

## 2019-08-01 MED ORDER — DIPHENHYDRAMINE HCL 50 MG/ML IJ SOLN: 12.5000 mg | INTRAMUSCULAR | Status: DC | PRN

## 2019-08-01 MED ORDER — PRENATAL MULTIVITAMIN CH
1.0000 | ORAL_TABLET | Freq: Every day | ORAL | Status: DC
  Administered 2019-08-02 – 2019-08-03 (×2): 1 via ORAL
  Filled 2019-08-01 (×2): qty 1

## 2019-08-01 MED ORDER — SENNOSIDES-DOCUSATE SODIUM 8.6-50 MG PO TABS
2.0000 | ORAL_TABLET | ORAL | Status: DC
  Administered 2019-08-02 (×2): 2 via ORAL
  Filled 2019-08-01 (×2): qty 2

## 2019-08-01 MED ORDER — DIBUCAINE (PERIANAL) 1 % EX OINT: 1.0000 "application " | TOPICAL_OINTMENT | CUTANEOUS | Status: DC | PRN

## 2019-08-01 MED ORDER — IBUPROFEN 600 MG PO TABS
600.0000 mg | ORAL_TABLET | Freq: Four times a day (QID) | ORAL | Status: DC
  Administered 2019-08-02 – 2019-08-03 (×8): 600 mg via ORAL
  Filled 2019-08-01 (×8): qty 1

## 2019-08-01 MED ORDER — LIDOCAINE HCL (PF) 1 % IJ SOLN
INTRAMUSCULAR | Status: DC | PRN
  Administered 2019-08-01: 11 mL via EPIDURAL

## 2019-08-01 MED ORDER — ACETAMINOPHEN 325 MG PO TABS: 650.0000 mg | ORAL_TABLET | ORAL | Status: DC | PRN

## 2019-08-01 MED ORDER — ONDANSETRON HCL 4 MG/2ML IJ SOLN: 4.0000 mg | INTRAMUSCULAR | Status: DC | PRN

## 2019-08-01 MED ORDER — LIDOCAINE HCL (PF) 1 % IJ SOLN: 30.0000 mL | INTRAMUSCULAR | Status: DC | PRN

## 2019-08-01 NOTE — Discharge Summary (Signed)
Postpartum Discharge Summary    Patient Name: Julie Winters DOB: 02-22-1986 MRN: [id number]  Date of admission: 08/01/2019 Delivering Provider: Clarnce Flock   Date of discharge: 08/03/2019  Admitting diagnosis: Labor and delivery indication for care or intervention [O75.9] Intrauterine pregnancy: [redacted]w[redacted]d    Secondary diagnosis:  Active Problems:   Encounter for supervision of normal pregnancy, unspecified, unspecified trimester   Maternal atypical antibody   Labor and delivery, indication for care   Vacuum-assisted vaginal delivery  Additional problems:      Discharge diagnosis: Term Pregnancy Delivered                                                                                                Post partum procedures:None  Augmentation: AROM  Complications: None  Hospital course:  Onset of Labor With Vaginal Delivery     34y.o. yo G3P1011 at 360w4das admitted in Latent Labor on 08/01/2019. Patient had an uncomplicated labor course as follows: admitted for SOL and DFM at term. Patient arrived at 4.5 cm dilation and progressed spontaneously to 5 cm. She had a prolonged deceleration and had AROM for moderate meconium followed by terbutaline after another prolonged. She subsequently progressed spontaneously to complete and started pushing, however she had prolonged decelerations and after three pushes she was consented for a VAVD. Membrane Rupture Time/Date: 4:42 PM ,08/01/2019   Intrapartum Procedures: Episiotomy: None [1]                                         Lacerations:  None [1]  Patient had a delivery of a Viable infant. 08/01/2019  Information for the patient's newborn:  OwSabina, Beavers0[[id number]]Delivery Method: Vaginal, Vacuum (Extractor)(Filed from Delivery Summary)     Pateint had an uncomplicated postpartum course. She is ambulating, tolerating a regular diet, passing flatus, and urinating well. Patient is discharged home in stable condition on  08/03/19.  Delivery time: 8:53 PM    Magnesium Sulfate received: No BMZ received: No Rhophylac:N/A MMR:N/A Transfusion:No  Physical exam  Vitals:   08/02/19 1118 08/02/19 1400 08/02/19 2057 08/03/19 0516  BP: (!) 106/59 102/73 107/65 (!) 86/53  Pulse: 77 79 83 65  Resp: '18 18 18 18  ' Temp: 99 F (37.2 C) 98.5 F (36.9 C) 98.3 F (36.8 C) 97.7 F (36.5 C)  TempSrc:  Oral Oral Oral  SpO2:   100% 100%  Weight:      Height:       General: alert, cooperative and no distress Lochia: appropriate Uterine Fundus: firm Incision: N/A DVT Evaluation: No evidence of DVT seen on physical exam. No significant calf/ankle edema. Labs: Lab Results  Component Value Date   WBC 10.0 08/01/2019   HGB 12.5 08/01/2019   HCT 39.0 08/01/2019   MCV 95.4 08/01/2019   PLT 160 08/01/2019   CMP Latest Ref Rng & Units 06/23/2019  Glucose 65 - 99 mg/dL 85  BUN 6 - 20 mg/dL 5(L)  Creatinine 0.57 - 1.00 mg/dL 0.55(L)  Sodium 134 - 144 mmol/L 136  Potassium 3.5 - 5.2 mmol/L 3.7  Chloride 96 - 106 mmol/L 103  CO2 20 - 29 mmol/L 20  Calcium 8.7 - 10.2 mg/dL 8.8  Total Protein 6.0 - 8.5 g/dL 5.9(L)  Total Bilirubin 0.0 - 1.2 mg/dL 0.3  Alkaline Phos 39 - 117 IU/L 137(H)  AST 0 - 40 IU/L 21  ALT 0 - 32 IU/L 16   Edinburgh Score: Edinburgh Postnatal Depression Scale Screening Tool 08/02/2019  I have been able to laugh and see the funny side of things. 0  I have looked forward with enjoyment to things. 0  I have blamed myself unnecessarily when things went wrong. 0  I have been anxious or worried for no good reason. 0  I have felt scared or panicky for no good reason. 0  Things have been getting on top of me. 0  I have been so unhappy that I have had difficulty sleeping. 0  I have felt sad or miserable. 0  I have been so unhappy that I have been crying. 0  The thought of harming myself has occurred to me. 0  Edinburgh Postnatal Depression Scale Total 0    Discharge instruction: per After  Visit Summary and "Baby and Me Booklet".  After visit meds:  Allergies as of 08/03/2019   No Known Allergies     Medication List    TAKE these medications   acetaminophen 325 MG tablet Commonly known as: Tylenol Take 2 tablets (650 mg total) by mouth every 4 (four) hours as needed (for pain scale < 4).   ibuprofen 600 MG tablet Commonly known as: ADVIL Take 1 tablet (600 mg total) by mouth every 6 (six) hours.   polyethylene glycol powder 17 GM/SCOOP powder Commonly known as: GLYCOLAX/MIRALAX Take 17 g by mouth daily as needed.   prenatal multivitamin Tabs tablet Take 1 tablet by mouth daily at 12 noon.       Diet: routine diet  Activity: Advance as tolerated. Pelvic rest for 6 weeks.   Outpatient follow up:6 weeks Follow up Appt: Future Appointments  Date Time Provider [place]  09/13/2019  1:00 PM Sloan Leiter, MD [facility] None   Follow up Visit:   Please schedule this patient for Postpartum visit in: 6 weeks with the following provider: Any provider In-Person For C/S patients schedule nurse incision check in weeks 2 weeks: no Low risk pregnancy complicated by: n/a Delivery mode:  Vacuum Anticipated Birth Control:  other/unsure PP Procedures needed: none  Schedule Integrated BH visit: no   Newborn Data: Live born female  Birth Weight:  3096 grams APGAR: 53, 9  Newborn Delivery   Birth date/time: 08/01/2019 20:53:00 Delivery type: Vaginal, Vacuum (Extractor)      Baby Feeding: Breast Disposition:home with mother   08/03/2019 Clarnce Flock, MD

## 2019-08-01 NOTE — Progress Notes (Signed)
Honi Name is a 34 y.o. G3P1011 at [redacted]w[redacted]d   Subjective: Requesting epidural  Objective: BP 112/66 (BP Location: Left Arm)   Pulse 96   Temp 98.2 F (36.8 C) (Oral)   Resp 18   Ht 5\' 7"  (1.702 m)   Wt 81.7 kg   LMP 10/28/2018   SpO2 99% Comment: room air  BMI 28.22 kg/m  No intake/output data recorded. No intake/output data recorded.  FHT:  FHR: 140 bpm, variability: moderate,  accelerations:  Present,  decelerations:  Present Prolonged UC:   irregular, every 3-4 minutes SVE:   Dilation: 4.5 Effacement (%): 80 Station: -3 Exam by:: [email or web address], RN  Labs: Lab Results  Component Value Date   WBC 10.0 08/01/2019   HGB 12.5 08/01/2019   HCT 39.0 08/01/2019   MCV 95.4 08/01/2019   PLT 160 08/01/2019    Assessment / Plan: --At bedside at 1637 for evaluation of decel --IV fluid bolus and repositioning in progress --Patient assisted to hands and knees --4-5/80/-2 per my exam --Good scalp stim, slow return to baseline --Dr. 10-12-1995 notified, inbound to bedside --AROM MSF, FSE placed by Dr. Alysia Penna --Reassuring fetal status.  --NPO effective now --Anesthesia inbound to place epidural --Assess for Pitocin augmentation when comfortable --Anticipate NSVD  Alysia Penna, CNM 08/01/2019, 5:04 PM

## 2019-08-01 NOTE — MAU Note (Signed)
Julie Winters is a 34 y.o. at [redacted]w[redacted]d here in MAU reporting: contractions started last night. Around 24 today they got worse. They are now every 5-7 minutes and more painful. No bleeding, no LOF. Some DFM.  Has not been seen since the beginning of March.  Onset of complaint: last night  Pain score: 6/10 FACES score, pt unable to verbalize pain score  Vitals:   08/01/19 1427  BP: 112/66  Pulse: 96  Resp: 18  Temp: 98.2 F (36.8 C)  SpO2: 99%     FHT: 153  Lab orders placed from triage: none

## 2019-08-01 NOTE — Consult Note (Signed)
Called to attend delivery.  Upon arrival, infant had already delivered and OB staff declined further assistance.   Karie Schwalbe, MD Neonatal-Perinatal Medicine

## 2019-08-01 NOTE — Progress Notes (Signed)
  At bedside for evaluation of third and fourth prolonged decelerations. Good variability throughout. Patient now 7-8/90%/0 to +1. Dr Alysia Penna at bedside to assess labor acuity. Patient internalized, s/p Terbutaline, tolerating hands and knees position very well. Continue NPO.  Clayton Bibles, MSN, CNM Certified Nurse Midwife, [facility] for [employer], [facility] 08/01/19 7:29 PM

## 2019-08-01 NOTE — H&P (Signed)
Julie Winters is a 34 y.o. female presenting for SOL and report of decreased fetal movement. Her painful and recurrent contractions began last night 07/31/2019 around 2100 hours. She denies vaginal bleeding, leaking of fluid, fever, falls, or recent illness.   Patient received prenatal care with [facility]. She initiated care at 12 weeks but has not had any prenatal visits since 06/20/2019.   Prenatal Care --[facility] --Care initiated at 12 weeks --Low risk NIPS --Increased risk SMA  --No referrals --Rubella Immune --TDAP not administered  OB History    Gravida  3   Para  1   Term  1   Preterm      AB  1   Living  1     SAB      TAB  1   Ectopic      Multiple  0   Live Births  1          Past Medical History:  Diagnosis Date  . Medical history non-contributory    Past Surgical History:  Procedure Laterality Date  . NO PAST SURGERIES     Family History: family history is not on file. Social History:  reports that she has never smoked. She has never used smokeless tobacco. She reports that she does not drink alcohol or use drugs.    Maternal Diabetes: No Genetic Screening: Abnormal:  Results: Other:Increased risk SMA carrier Maternal Ultrasounds/Referrals: Normal Fetal Ultrasounds or other Referrals:  None Maternal Substance Abuse:  No Significant Maternal Medications:  None Significant Maternal Lab Results:  Other: POS antibody screen 04/18/2019, retest normal 06/23/2019 Other Comments:  None  Review of Systems  Eyes: Negative for visual disturbance.  Gastrointestinal: Positive for abdominal pain.  Genitourinary: Negative for vaginal bleeding.  Musculoskeletal: Negative for back pain.  Neurological: Negative for headaches.  All other systems reviewed and are negative.  Dilation: 4.5 Effacement (%): 80 Station: -3 Exam by:: Freddy Finner, RN Blood pressure 112/66, pulse 96, temperature 98.2 F (36.8 C), temperature source Oral, resp. rate 18,  last menstrual period 10/28/2018, SpO2 99 %, currently breastfeeding. Physical Exam  Nursing note and vitals reviewed. Constitutional: She is oriented to person, place, and time. She appears well-developed and well-nourished.  Cardiovascular: Normal rate and normal heart sounds.  Respiratory: Effort normal. She has no decreased breath sounds.  GI: She exhibits no distension. There is no abdominal tenderness. There is no rebound and no guarding.  Gravid  Musculoskeletal:        General: Normal range of motion.  Neurological: She is alert and oriented to person, place, and time.  Skin: Skin is warm and dry.  Psychiatric: She has a normal mood and affect. Her behavior is normal. Judgment and thought content normal.    Prenatal labs: ABO, Rh: A/Positive/-- (10/08 1505) Antibody: Negative (03/11 1259) Rubella: 4.62 (10/08 1505) RPR: Non Reactive (01/25 1007)  HBsAg: Negative (10/08 1505)  HIV: Non Reactive (01/25 1007)  GBS:   Unknown  Assessment/Plan: --Cat I tracing: baseline 140, mod variability, positive accels, no decels --Toco: irregular ctx q 3-4 min, palpate moderate --GBS unknown in term and intact patient, PCN prophylaxis not indicated --Discussed contraction pattern and options for expectant management vs. Pitocin augmentation. --Discussed that baby is -3, AROM contraindicated at this time --Patient elects expectant management --May have epidural on request --boy, does not desire circ/breast/contraception undecided --Recheck cervix in 4 hours or as indicated by change in labor status --Anticipate NSVD  Calvert Cantor, CNM 08/01/2019, 4:17 PM

## 2019-08-01 NOTE — Anesthesia Procedure Notes (Signed)
Epidural Patient location during procedure: OB Start time: 08/01/2019 5:02 PM End time: 08/01/2019 5:11 PM  Staffing Anesthesiologist: Lowella Curb, MD Performed: anesthesiologist   Preanesthetic Checklist Completed: patient identified, IV checked, site marked, risks and benefits discussed, surgical consent, monitors and equipment checked, pre-op evaluation and timeout performed  Epidural Patient position: sitting Prep: ChloraPrep Patient monitoring: heart rate, cardiac monitor, continuous pulse ox and blood pressure Approach: midline Location: L2-L3 Injection technique: LOR saline  Needle:  Needle type: Tuohy  Needle gauge: 17 G Needle length: 9 cm Needle insertion depth: 5 cm Catheter type: closed end flexible Catheter size: 20 Guage Catheter at skin depth: 9 cm Test dose: negative  Assessment Events: blood not aspirated, injection not painful, no injection resistance, no paresthesia and negative IV test  Additional Notes Epidural placed by SRNA under direct supervisionReason for block:procedure for pain

## 2019-08-01 NOTE — Progress Notes (Signed)
Shala Baumbach is a 34 y.o. G3P1011 at [redacted]w[redacted]d   Subjective: S/p epidural. Continues to feel pain but pain score is improving. FOB Emmanuel at bedside  Objective: BP (!) 102/51   Pulse 97   Temp 98.8 F (37.1 C) (Oral)   Resp 18   Ht 5\' 7"  (1.702 m)   Wt 81.7 kg   LMP 10/28/2018   SpO2 99%   BMI 28.22 kg/m  No intake/output data recorded. No intake/output data recorded.  FHT:  FHR: 145 bpm, variability: moderate,  accelerations:  Present,  decelerations:  Present prolonged UC:   irregular, every 2-3 minutes SVE:   Dilation: 6 Effacement (%): 70 Station: -1 Exam by:: [email or web address] CNM  Labs: Lab Results  Component Value Date   WBC 10.0 08/01/2019   HGB 12.5 08/01/2019   HCT 39.0 08/01/2019   MCV 95.4 08/01/2019   PLT 160 08/01/2019    Assessment / Plan: --At bedside for second prolonged deceleration --Improving with reposition to left lateral, IV fluid  --Now 5-6/ 70/-1 --Amnioinfusion in place --Maintain NPO  08/03/2019, CNM 08/01/2019, 6:38 PM

## 2019-08-01 NOTE — Anesthesia Preprocedure Evaluation (Signed)
Anesthesia Evaluation  Patient identified by MRN, date of birth, ID band Patient awake    Reviewed: Allergy & Precautions, NPO status , Patient's Chart, lab work & pertinent test results  History of Anesthesia Complications Negative for: history of anesthetic complications  Airway Mallampati: II  TM Distance: >3 FB Neck ROM: Full    Dental  (+) Teeth Intact   Pulmonary neg pulmonary ROS,    breath sounds clear to auscultation       Cardiovascular negative cardio ROS   Rhythm:Regular     Neuro/Psych negative neurological ROS  negative psych ROS   GI/Hepatic negative GI ROS, Neg liver ROS,   Endo/Other  negative endocrine ROS  Renal/GU negative Renal ROS     Musculoskeletal   Abdominal   Peds  Hematology  (+) anemia ,   Anesthesia Other Findings   Reproductive/Obstetrics (+) Pregnancy                             Anesthesia Physical  Anesthesia Plan  ASA: II  Anesthesia Plan: Epidural   Post-op Pain Management:    Induction:   PONV Risk Score and Plan:   Airway Management Planned:   Additional Equipment:   Intra-op Plan:   Post-operative Plan:   Informed Consent: I have reviewed the patients History and Physical, chart, labs and discussed the procedure including the risks, benefits and alternatives for the proposed anesthesia with the patient or authorized representative who has indicated his/her understanding and acceptance.       Plan Discussed with: Anesthesiologist  Anesthesia Plan Comments:         Anesthesia Quick Evaluation

## 2019-08-02 ENCOUNTER — Encounter (HOSPITAL_COMMUNITY): Payer: Self-pay | Admitting: Obstetrics and Gynecology

## 2019-08-02 ENCOUNTER — Encounter: Payer: Medicaid Other | Admitting: Obstetrics & Gynecology

## 2019-08-02 LAB — RPR: RPR Ser Ql: NONREACTIVE

## 2019-08-02 NOTE — Lactation Note (Signed)
This note was copied from a baby's chart. Lactation Consultation Note  Patient Name: Boy Amanada Philbrick [id number] Date: 08/02/2019  Infant getting hearing screen on arrival.  Mom reports no milk.  Mom reports he wants to eat really often and she dopes not have enough.  Infant 23 hours.  Adequate voids/stools/minimal weight loss.  Discussed cluster feeding.  Urged hand expression and spoon feeding past breastfeeding. Showed mom how to hand express and infant offered drops to suck on a gloved finger to try and assist with hearing screen.    He was hungry and unable to pass screen.  Tech attempted to screen him while he was at breast once he got relaxed. Assited with breastfeeding in laid back breastfeeding.  Mom reports worried about his nose.  Showed her that if he was postioned correctly he should not be smashed into breast and she should not have to pull tissue away from nose.  Explained his chin, cheeks should touch breast and nose may touch. Good breastfeeding observed Urged to feed on cue and 8-12 or more times day.  Call lactation as needed.    Maternal Data    Feeding Feeding Type: Breast Fed  LATCH Score Latch: Grasps breast easily, tongue down, lips flanged, rhythmical sucking.  Audible Swallowing: A few with stimulation  Type of Nipple: Everted at rest and after stimulation  Comfort (Breast/Nipple): Soft / non-tender  Hold (Positioning): Assistance needed to correctly position infant at breast and maintain latch.  LATCH Score: 8  Interventions    Lactation Tools Discussed/Used     Consult Status      Ronon Ferger Michaelle Copas 08/02/2019, 9:38 PM

## 2019-08-02 NOTE — Progress Notes (Signed)
POSTPARTUM PROGRESS NOTE  Post Partum Day 1  Subjective:  Julie Winters is a 34 y.o. H8O8757 s/p VAVD at [redacted]w[redacted]d.  She reports she is doing well. No acute events overnight. She denies any problems with ambulating, voiding or po intake. Denies nausea or vomiting.  Pain is well controlled.  Lochia is like a period.  Objective: Blood pressure 100/67, pulse 79, temperature 98.1 F (36.7 C), temperature source Oral, resp. rate 18, height 5\' 7"  (1.702 m), weight 81.7 kg, last menstrual period 10/28/2018, SpO2 99 %, unknown if currently breastfeeding.  Physical Exam:  General: alert, cooperative and no distress Chest: no respiratory distress Heart:regular rate, distal pulses intact Abdomen: soft, nontender,  Uterine Fundus: firm, appropriately tender DVT Evaluation: No calf swelling or tenderness Extremities: no LE edema Skin: warm, dry  Recent Labs    08/01/19 1525  HGB 12.5  HCT 39.0    Assessment/Plan: Julie Winters is a 34 y.o. 34 s/p VAVD at [redacted]w[redacted]d   PPD#1 - Doing well  Routine postpartum care Contraception: undecided still, will discuss with partner. Reviewed options for Nexplanon and Depo prior to discharge if she is interested.  Feeding: breast Dispo: Plan for discharge PPD#2.   LOS: 1 day   [redacted]w[redacted]d, MD/MP[facility] OB Fellow  08/02/2019, 7:29 AM

## 2019-08-02 NOTE — Lactation Note (Signed)
This note was copied from a baby's chart. Lactation Consultation Note Baby 3 hrs. Old. Mom awake and resting. Baby awake cueing. Mentioned to mom as LC swaddling baby that baby is cueing and wants to eat. Mom stated "he wants to eat again"? Newborn behavior, feeding habits, STS, I&O, breast massage discussed. Encouraged mom to feed on demand. Baby fell asleep instantly.  Mom BF her now 34 yr old for 9 months. Mom stated it has been a long time. Hand expression demonstrated. No colostrum noted. Mom has Large everted nipples. Asked mom if it was painful when she BF, mom stated she felt tugging no pain.  Encouraged mom to rest while baby rest. Encouraged to call for assistance. Lactation brochure given.,  Patient Name: Julie Winters [id number] Date: 08/02/2019 Reason for consult: Initial assessment;Term   Maternal Data Has patient been taught Hand Expression?: Yes Does the patient have breastfeeding experience prior to this delivery?: Yes  Feeding Feeding Type: Breast Fed  LATCH Score Latch: Repeated attempts needed to sustain latch, nipple held in mouth throughout feeding, stimulation needed to elicit sucking reflex.  Audible Swallowing: Spontaneous and intermittent  Type of Nipple: Everted at rest and after stimulation  Comfort (Breast/Nipple): Soft / non-tender  Hold (Positioning): Assistance needed to correctly position infant at breast and maintain latch.  LATCH Score: 8  Interventions Interventions: Breast feeding basics reviewed;Hand express;Breast compression;Breast massage  Lactation Tools Discussed/Used WIC Program: Yes   Consult Status Consult Status: Follow-up Date: 08/02/19 Follow-up type: In-patient    Charyl Dancer 08/02/2019, 12:44 AM

## 2019-08-02 NOTE — Anesthesia Postprocedure Evaluation (Signed)
Anesthesia Post Note  Patient: Julie Winters  Procedure(s) Performed: AN AD HOC LABOR EPIDURAL     Patient location during evaluation: Mother Baby Anesthesia Type: Epidural Level of consciousness: awake and alert and oriented Pain management: satisfactory to patient Vital Signs Assessment: post-procedure vital signs reviewed and stable Respiratory status: respiratory function stable Cardiovascular status: stable Postop Assessment: no headache, no backache, epidural receding, patient able to bend at knees, no signs of nausea or vomiting and adequate PO intake Anesthetic complications: no    Last Vitals:  Vitals:   08/02/19 0633 08/02/19 0834  BP: 100/67 121/69  Pulse: 79 73  Resp: 18 18  Temp: 36.7 C 36.5 C  SpO2: 99% 100%    Last Pain:  Vitals:   08/02/19 0835  TempSrc:   PainSc: 2    Pain Goal:                   Julie Winters

## 2019-08-03 MED ORDER — POLYETHYLENE GLYCOL 3350 17 GM/SCOOP PO POWD: 17.0000 g | Freq: Every day | ORAL | 1 refills | Status: DC | PRN

## 2019-08-03 MED ORDER — ACETAMINOPHEN 325 MG PO TABS: 650.0000 mg | ORAL_TABLET | ORAL | 0 refills | Status: DC | PRN

## 2019-08-03 MED ORDER — IBUPROFEN 600 MG PO TABS: 600.0000 mg | ORAL_TABLET | Freq: Four times a day (QID) | ORAL | 0 refills | Status: DC

## 2019-08-03 NOTE — Lactation Note (Signed)
This note was copied from a baby's chart. Lactation Consultation Note:  Mother has lots of questions about making more milk. herbal galactagogues were discussed. Mother advised to continue to drain her breast well.  Discussed treatment and prevention of engorgement.   Mother to continue to cue base feed infant and feed at least 8-12 times or more in 24 hours and advised to allow for cluster feeding infant as needed.   Mother to continue to due STS. Mother is aware of available LC services at [facility], BFSG'S, OP Dept, and phone # for questions or concerns about breastfeeding.  Mother receptive to all teaching and plan of care.    Patient Name: Julie Winters [id number] Date: 08/03/2019     Maternal Data    Feeding Feeding Type: Breast Fed  LATCH Score                   Interventions    Lactation Tools Discussed/Used     Consult Status      Julie Winters 08/03/2019, 2:28 PM

## 2019-08-03 NOTE — Discharge Instructions (Signed)
Postpartum Care After Vaginal Delivery This sheet gives you information about how to care for yourself from the time you deliver your baby to up to 6-12 weeks after delivery (postpartum period). Your health care provider may also give you more specific instructions. If you have problems or questions, contact your health care provider. Follow these instructions at home: Vaginal bleeding  It is normal to have vaginal bleeding (lochia) after delivery. Wear a sanitary pad for vaginal bleeding and discharge. ? During the first week after delivery, the amount and appearance of lochia is often similar to a menstrual period. ? Over the next few weeks, it will gradually decrease to a dry, yellow-brown discharge. ? For most women, lochia stops completely by 4-6 weeks after delivery. Vaginal bleeding can vary from woman to woman.  Change your sanitary pads frequently. Watch for any changes in your flow, such as: ? A sudden increase in volume. ? A change in color. ? Large blood clots.  If you pass a blood clot from your vagina, save it and call your health care provider to discuss. Do not flush blood clots down the toilet before talking with your health care provider.  Do not use tampons or douches until your health care provider says this is safe.  If you are not breastfeeding, your period should return 6-8 weeks after delivery. If you are feeding your child breast milk only (exclusive breastfeeding), your period may not return until you stop breastfeeding. Perineal care  Keep the area between the vagina and the anus (perineum) clean and dry as told by your health care provider. Use medicated pads and pain-relieving sprays and creams as directed.  If you had a cut in the perineum (episiotomy) or a tear in the vagina, check the area for signs of infection until you are healed. Check for: ? More redness, swelling, or pain. ? Fluid or blood coming from the cut or tear. ? Warmth. ? Pus or a bad  smell.  You may be given a squirt bottle to use instead of wiping to clean the perineum area after you go to the bathroom. As you start healing, you may use the squirt bottle before wiping yourself. Make sure to wipe gently.  To relieve pain caused by an episiotomy, a tear in the vagina, or swollen veins in the anus (hemorrhoids), try taking a warm sitz bath 2-3 times a day. A sitz bath is a warm water bath that is taken while you are sitting down. The water should only come up to your hips and should cover your buttocks. Breast care  Within the first few days after delivery, your breasts may feel heavy, full, and uncomfortable (breast engorgement). Milk may also leak from your breasts. Your health care provider can suggest ways to help relieve the discomfort. Breast engorgement should go away within a few days.  If you are breastfeeding: ? Wear a bra that supports your breasts and fits you well. ? Keep your nipples clean and dry. Apply creams and ointments as told by your health care provider. ? You may need to use breast pads to absorb milk that leaks from your breasts. ? You may have uterine contractions every time you breastfeed for up to several weeks after delivery. Uterine contractions help your uterus return to its normal size. ? If you have any problems with breastfeeding, work with your health care provider or lactation consultant.  If you are not breastfeeding: ? Avoid touching your breasts a lot. Doing this can make   your breasts produce more milk. ? Wear a good-fitting bra and use cold packs to help with swelling. ? Do not squeeze out (express) milk. This causes you to make more milk. Intimacy and sexuality  Ask your health care provider when you can engage in sexual activity. This may depend on: ? Your risk of infection. ? How fast you are healing. ? Your comfort and desire to engage in sexual activity.  You are able to get pregnant after delivery, even if you have not had  your period. If desired, talk with your health care provider about methods of birth control (contraception). Medicines  Take over-the-counter and prescription medicines only as told by your health care provider.  If you were prescribed an antibiotic medicine, take it as told by your health care provider. Do not stop taking the antibiotic even if you start to feel better. Activity  Gradually return to your normal activities as told by your health care provider. Ask your health care provider what activities are safe for you.  Rest as much as possible. Try to rest or take a nap while your baby is sleeping. Eating and drinking   Drink enough fluid to keep your urine pale yellow.  Eat high-fiber foods every day. These may help prevent or relieve constipation. High-fiber foods include: ? Whole grain cereals and breads. ? Brown rice. ? Beans. ? Fresh fruits and vegetables.  Do not try to lose weight quickly by cutting back on calories.  Take your prenatal vitamins until your postpartum checkup or until your health care provider tells you it is okay to stop. Lifestyle  Do not use any products that contain nicotine or tobacco, such as cigarettes and e-cigarettes. If you need help quitting, ask your health care provider.  Do not drink alcohol, especially if you are breastfeeding. General instructions  Keep all follow-up visits for you and your baby as told by your health care provider. Most women visit their health care provider for a postpartum checkup within the first 3-6 weeks after delivery. Contact a health care provider if:  You feel unable to cope with the changes that your child brings to your life, and these feelings do not go away.  You feel unusually sad or worried.  Your breasts become red, painful, or hard.  You have a fever.  You have trouble holding urine or keeping urine from leaking.  You have little or no interest in activities you used to enjoy.  You have not  breastfed at all and you have not had a menstrual period for 12 weeks after delivery.  You have stopped breastfeeding and you have not had a menstrual period for 12 weeks after you stopped breastfeeding.  You have questions about caring for yourself or your baby.  You pass a blood clot from your vagina. Get help right away if:  You have chest pain.  You have difficulty breathing.  You have sudden, severe leg pain.  You have severe pain or cramping in your lower abdomen.  You bleed from your vagina so much that you fill more than one sanitary pad in one hour. Bleeding should not be heavier than your heaviest period.  You develop a severe headache.  You faint.  You have blurred vision or spots in your vision.  You have bad-smelling vaginal discharge.  You have thoughts about hurting yourself or your baby. If you ever feel like you may hurt yourself or others, or have thoughts about taking your own life, get help  right away. You can go to the nearest emergency department or call:  Your local emergency services (911 in the [place]  A suicide crisis helpline, such as the National Suicide Prevention Lifeline at 1-800-273-8255. This is open 24 hours a day. Summary  The period of time right after you deliver your newborn up to 6-12 weeks after delivery is called the postpartum period.  Gradually return to your normal activities as told by your health care provider.  Keep all follow-up visits for you and your baby as told by your health care provider. This information is not intended to replace advice given to you by your health care provider. Make sure you discuss any questions you have with your health care provider. Document Revised: 04/03/2017 Document Reviewed: 01/12/2017 Elsevier Patient Education  2020 [employer] Inc[employer]    Contraception Choices Contraception, also called birth control, means things to use or ways to try not to get pregnant. Hormonal birth control This kind  of birth control uses hormones. Here are some types of hormonal birth control:  A tube that is put under skin of the arm (implant). The tube can stay in for as long as 3 years.  Shots to get every 3 months (injections).  Pills to take every day (birth control pills).  A patch to change 1 time each week for 3 weeks (birth control patch). After that, the patch is taken off for 1 week.  A ring to put in the vagina. The ring is left in for 3 weeks. Then it is taken out of the vagina for 1 week. Then a new ring is put in.  Pills to take after unprotected sex (emergency birth control pills). Barrier birth control Here are some types of barrier birth control:  A thin covering that is put on the penis before sex (female condom). The covering is thrown away after sex.  A soft, loose covering that is put in the vagina before sex (female condom). The covering is thrown away after sex.  A rubber bowl that sits over the cervix (diaphragm). The bowl must be made for you. The bowl is put into the vagina before sex. The bowl is left in for 6-8 hours after sex. It is taken out within 24 hours.  A small, soft cup that fits over the cervix (cervical cap). The cup must be made for you. The cup can be left in for 6-8 hours after sex. It is taken out within 48 hours.  A sponge that is put into the vagina before sex. It must be left in for at least 6 hours after sex. It must be taken out within 30 hours. Then it is thrown away.  A chemical that kills or stops sperm from getting into the uterus (spermicide). It may be a pill, cream, jelly, or foam to put in the vagina. The chemical should be used at least 10-15 minutes before sex. IUD (intrauterine) birth control An IUD is a small, T-shaped piece of plastic. It is put inside the uterus. There are two kinds:  Hormone IUD. This kind can stay in for 3-5 years.  Copper IUD. This kind can stay in for 10 years. Permanent birth control Here are some types of  permanent birth control:  Surgery to block the fallopian tubes.  Having an insert put into each fallopian tube.  Surgery to tie off the tubes that carry sperm (vasectomy). Natural planning birth control Here are some types of natural planning birth control:  Not having sex on   the days the woman could get pregnant.  Using a calendar: ? To keep track of the length of each period. ? To find out what days pregnancy can happen. ? To plan to not have sex on days when pregnancy can happen.  Watching for symptoms of ovulation and not having sex during ovulation. One way the woman can check for ovulation is to check her temperature.  Waiting to have sex until after ovulation. Summary  Contraception, also called birth control, means things to use or ways to try not to get pregnant.  Hormonal methods of birth control include implants, injections, pills, patches, vaginal rings, and emergency birth control pills.  Barrier methods of birth control can include female condoms, female condoms, diaphragms, cervical caps, sponges, and spermicides.  There are two types of IUD (intrauterine device) birth control. An IUD can be put in a woman's uterus to prevent pregnancy for 3-5 years.  Permanent sterilization can be done through a procedure for males, females, or both.  Natural planning methods involve not having sex on the days when the woman could get pregnant. This information is not intended to replace advice given to you by your health care provider. Make sure you discuss any questions you have with your health care provider. Document Revised: 07/21/2018 Document Reviewed: 04/10/2016 Elsevier Patient Education  2020 [employer].

## 2019-08-04 ENCOUNTER — Ambulatory Visit: Payer: Self-pay

## 2019-08-04 LAB — CULTURE, BETA STREP (GROUP B ONLY)

## 2019-08-04 NOTE — Lactation Note (Signed)
This note was copied from a baby's chart. Lactation Consultation Note  Patient Name: Julie Winters HEBBW'N Date: 08/04/2019 Reason for consult: Follow-up assessment Baby is 60 hours old/3% weight loss.  Mom reports that baby is latching to breast well.  Mom recently pumped 60 mls of transitional milk using manual pump.  FOB feeding baby formula.  Stressed importance of breastfeeding with cues and supplementing with expressed milk.  Encouraged to call for concerns/assist prn.  Maternal Data    Feeding Feeding Type: Breast Fed  LATCH Score                   Interventions Interventions: Hand pump  Lactation Tools Discussed/Used     Consult Status Consult Status: Follow-up Date: 08/05/19 Follow-up type: In-patient    [name] 08/04/2019, 9:39 AM

## 2019-09-13 ENCOUNTER — Ambulatory Visit: Payer: Medicaid Other | Admitting: Obstetrics & Gynecology

## 2020-03-05 ENCOUNTER — Ambulatory Visit (INDEPENDENT_AMBULATORY_CARE_PROVIDER_SITE_OTHER): Payer: Self-pay | Admitting: Plastic Surgery

## 2020-03-05 ENCOUNTER — Encounter: Payer: Self-pay | Admitting: Plastic Surgery

## 2020-03-05 ENCOUNTER — Other Ambulatory Visit: Payer: Self-pay

## 2020-03-05 DIAGNOSIS — S01311A Laceration without foreign body of right ear, initial encounter: Secondary | ICD-10-CM

## 2020-03-05 DIAGNOSIS — S01319A Laceration without foreign body of unspecified ear, initial encounter: Secondary | ICD-10-CM | POA: Insufficient documentation

## 2020-03-05 NOTE — Progress Notes (Signed)
     Patient ID: Julie Winters, female    DOB: 1985-08-04, 34 y.o.   MRN: [id number]   Chief Complaint  Patient presents with  . Consult    13-year-old female here for evaluation of her ears.  Patient had tenderness of her right earlobe the all 4 while she was sleeping nothing looks infected and no area of concern.  She is otherwise healthy she is due to be married next month.  The left earlobe is not warm.   Review of Systems  Constitutional: Negative.  Negative for activity change.  HENT: Negative.   Eyes: Negative for discharge and itching.  Respiratory: Negative.  Negative for chest tightness.   Cardiovascular: Negative.   Gastrointestinal: Negative.   Endocrine: Negative.   Genitourinary: Negative.     Past Medical History:  Diagnosis Date  . Medical history non-contributory     Past Surgical History:  Procedure Laterality Date  . NO PAST SURGERIES        Current Outpatient Medications:  .  polyethylene glycol powder (GLYCOLAX/MIRALAX) 17 GM/SCOOP powder, Take 17 g by mouth daily as needed., Disp: 510 g, Rfl: 1 .  Prenatal Vit-Fe Fumarate-FA (PRENATAL MULTIVITAMIN) TABS tablet, Take 1 tablet by mouth daily at 12 noon., Disp: , Rfl:  .  acetaminophen (TYLENOL) 325 MG tablet, Take 2 tablets (650 mg total) by mouth every 4 (four) hours as needed (for pain scale < 4). (Patient not taking: Reported on 03/05/2020), Disp: 30 tablet, Rfl: 0 .  ibuprofen (ADVIL) 600 MG tablet, Take 1 tablet (600 mg total) by mouth every 6 (six) hours. (Patient not taking: Reported on 03/05/2020), Disp: 30 tablet, Rfl: 0   Objective:   Vitals:   03/05/20 0852  BP: 101/72  Pulse: 82  SpO2: 100%    Physical Exam Vitals and nursing note reviewed.  Constitutional:      Appearance: Normal appearance.  HENT:     Head: Normocephalic.  Cardiovascular:     Rate and Rhythm: Normal rate.  Skin:    Capillary Refill: Capillary refill takes less than 2 seconds.  Neurological:     General:  No focal deficit present.     Mental Status: She is alert and oriented to person, place, and time.  Psychiatric:        Mood and Affect: Mood normal.        Behavior: Behavior normal.        Thought Content: Thought content normal.     Assessment & Plan:  Tear of right earlobe, initial encounter  Recommend repair of the torn holes of her right ear.  The traditional 1 I would recommend closing in a Z-plasty.  This will provide strengths have it pierced again in 3 months.  The other 2 I highly recommend closing directly without a Z-plasty it does not distort her earlobe.  This means that it could not be easily pierced in the future without a risk of tearing again.  The patient acknowledges understanding this and agreeing with this plan.  She is due to be married in December and therefore we will plan to do this in January.  Julie Bills Rashanna Christiana, DO

## 2020-05-11 ENCOUNTER — Ambulatory Visit (INDEPENDENT_AMBULATORY_CARE_PROVIDER_SITE_OTHER): Payer: Self-pay | Admitting: Plastic Surgery

## 2020-05-11 ENCOUNTER — Other Ambulatory Visit: Payer: Self-pay

## 2020-05-11 ENCOUNTER — Encounter: Payer: Self-pay | Admitting: Plastic Surgery

## 2020-05-11 VITALS — BP 104/68 | HR 70

## 2020-05-11 DIAGNOSIS — S01311A Laceration without foreign body of right ear, initial encounter: Secondary | ICD-10-CM

## 2020-05-11 NOTE — Progress Notes (Signed)
Procedure Note  Preoperative Dx: right split ear lobe  Postoperative Dx: right split ear lobe  Procedure: repair of right split ear lobe  Anesthesia: Lidocaine 1% with 1:100,000 epinepherine  Indication for Procedure: split ear lobes  Description of Procedure: Risks and complications were explained to the patient. Consent was confirmed and signed. Time out was called and all information was confirmed to be correct. The ear lobe was prepped with betadine and drapped. A z plasty type mark was made and then lidocaine 1% with epinepherine was injected in the subcutaneous area. After waiting several minutes for the lidocaine to take affect an #11 blade was used to make the incisions which included cutting out the previous epithelialized hole.  The skin edges were reapproximated with 5-0 Monocryl interrupted sutures. Steri strips were applied. The patient is to follow up in one week. She tolerated the procedure well and there were no complications. Risks and complicatins were discussed and consent signed.

## 2020-05-20 NOTE — Progress Notes (Signed)
   Subjective:     Patient ID: Julie Winters, female    DOB: Jul 11, 1985, 35 y.o.   MRN: [id number]  Chief Complaint  Patient presents with  . Follow-up    HPI: The patient is a 35 y.o. female here for follow-up after undergoing repair of right split earlobe on 05/11/2020 with Dr. Ulice Bold.  ~ 2 weeks PO Patient reports she is doing well.  No complaints.  Denies fever/chills, nausea/vomiting, right ear pain.  She reports she may need to come have her left earlobe repaired sometime in the near future.  Review of Systems  Constitutional: Negative for chills and fever.  Respiratory: Negative for shortness of breath.   Cardiovascular: Negative for chest pain.  Gastrointestinal: Negative for constipation, diarrhea, nausea and vomiting.  Skin: Negative for color change, pallor, rash and wound.     Objective:   Vital Signs BP 107/73 (BP Location: Left Arm, Patient Position: Sitting, Cuff Size: Normal)   Pulse 64   LMP 05/21/2020 (Exact Date)   SpO2 100%  Vital Signs and Nursing Note Reviewed  Physical Exam Constitutional:      General: She is not in acute distress.    Appearance: Normal appearance. She is normal weight. She is not ill-appearing.  HENT:     Head: Normocephalic and atraumatic.     Comments: Right earlobe incision is healing very nicely, C/D/I.  No signs of infection, redness, drainage. Eyes:     Extraocular Movements: Extraocular movements intact and EOM normal.  Cardiovascular:     Rate and Rhythm: Normal rate.  Pulmonary:     Effort: Pulmonary effort is normal.  Musculoskeletal:        General: Normal range of motion.     Cervical back: Normal range of motion.  Skin:    General: Skin is warm and dry.     Coloration: Skin is not pale.     Findings: No erythema or rash.  Neurological:     Mental Status: She is alert and oriented to person, place, and time.     Gait: Gait is intact.  Psychiatric:        Mood and Affect: Mood and affect normal.         Behavior: Behavior normal.        Thought Content: Thought content normal.        Cognition and Memory: Memory normal.        Judgment: Judgment normal.   Pictures were obtained of the patient and placed in the chart with the patient's or guardian's permission.       Assessment/Plan:     ICD-10-CM   1. Tear of right earlobe, subsequent encounter  S01.311D    Patient is doing well.  Right earlobe is healing very nicely, C/D/I.  No signs of infection, redness, drainage.  Sutures were removed today.  May apply Vaseline as desired for moisture.  Follow-up as needed.  Return precautions provided.  Call office with any questions/concerns.  Eldridge Abrahams, PA-C 05/24/2020, 11:09 AM

## 2020-05-24 ENCOUNTER — Ambulatory Visit: Payer: Medicaid Other | Admitting: Plastic Surgery

## 2020-05-24 ENCOUNTER — Encounter: Payer: Self-pay | Admitting: Plastic Surgery

## 2020-05-24 ENCOUNTER — Other Ambulatory Visit: Payer: Self-pay

## 2020-05-24 ENCOUNTER — Ambulatory Visit (INDEPENDENT_AMBULATORY_CARE_PROVIDER_SITE_OTHER): Payer: Medicaid Other | Admitting: Plastic Surgery

## 2020-05-24 VITALS — BP 107/73 | HR 64

## 2020-05-24 DIAGNOSIS — S01311D Laceration without foreign body of right ear, subsequent encounter: Secondary | ICD-10-CM

## 2021-04-14 NOTE — L&D Delivery Note (Signed)
[facility] Delivery Note  Ashland Osmer is a 36 y.o. [id number] s/p SVD at [redacted]w[redacted]d. She was admitted for IUFD.   Delivery Date/Time: 12/05/21, 11:44am Delivery: Called to room and fetus was at perineum. Cord clamped and cut and fetus was taken to the side to wrap up. Patient and support person wished to hold. Placenta still within the cervix on bimanual exam. Miso 400 buccal given. Will monitor bleeding and will await placenta. Reviewed with patient surgery may be indicated if placenta doesn't delivery spontaneously and bleeding is excessive Baby Weight: pending  Placenta: Sent to pathology Complications: None Lacerations: None EBL: 467 mL Analgesia: IV and PO medications  Discussed option for genetics with patient. She will consider.   Milas Hock, MD Attending Obstetrician & Gynecologist, [facility] for [facility], [facility]

## 2021-08-28 IMAGING — US US MFM OB DETAIL+14 WK
1 series · 13 of 28 positions shown · non-contrast
Comparison: none

[Series 1: us mfm ob detail+14 wk · 123 acquisitions, 13 frames shown]
[im 5/123]
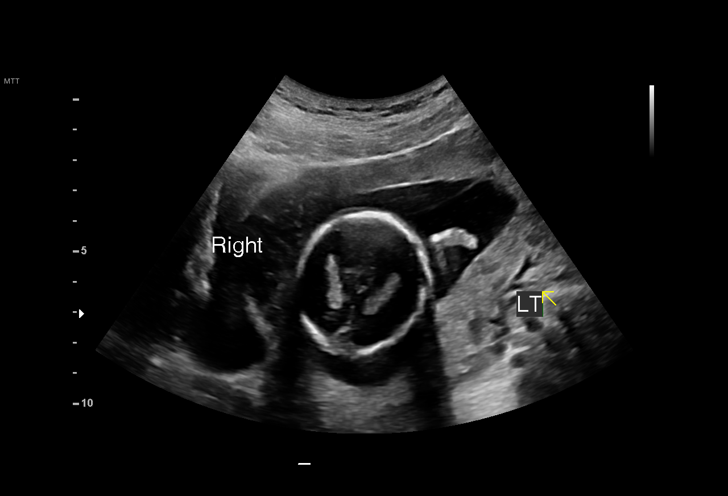
[im 14/123]
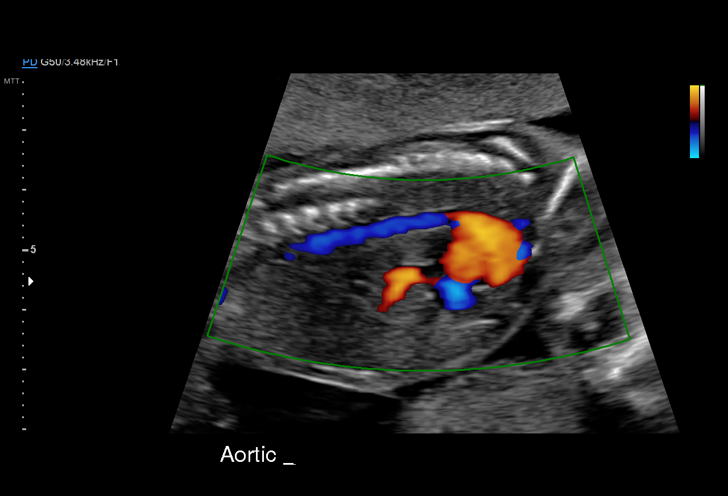
[im 23/123]
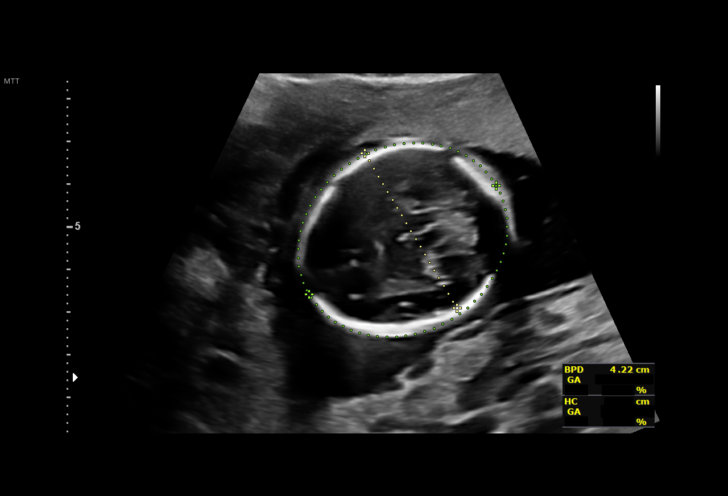
[im 32/123]
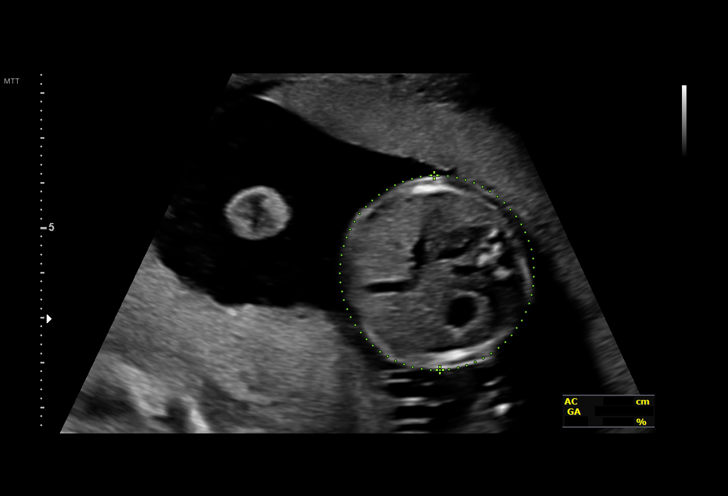
[im 41/123]
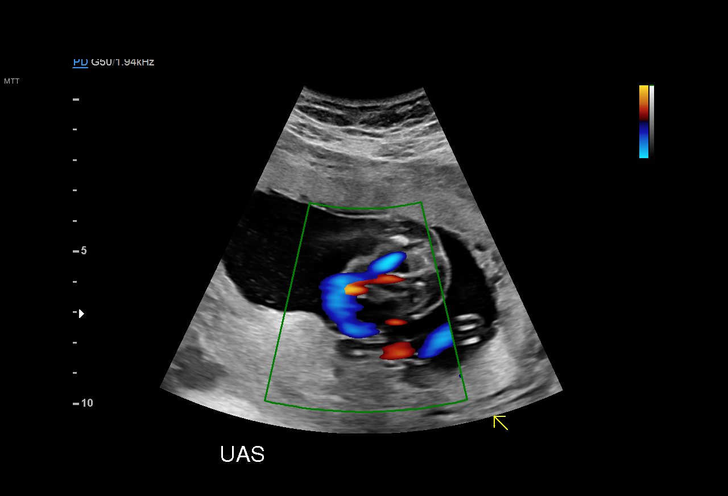
[im 50/123]
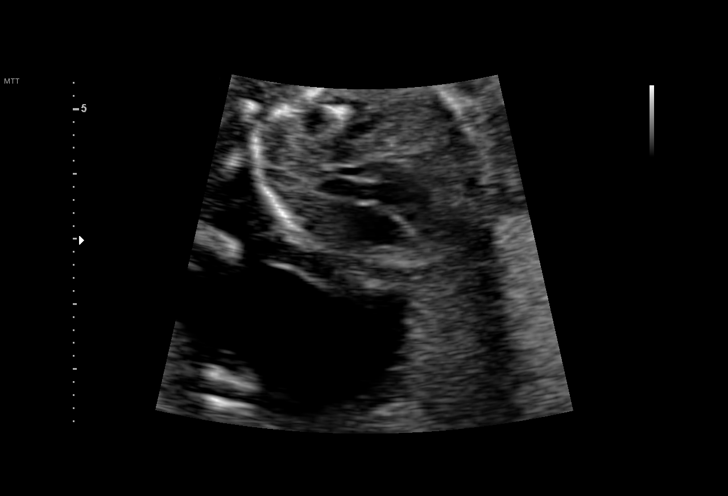
[im 64/123]
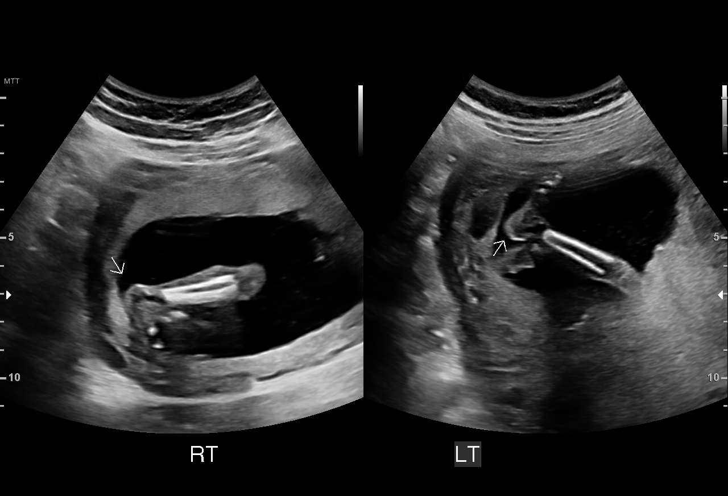
[im 73/123]
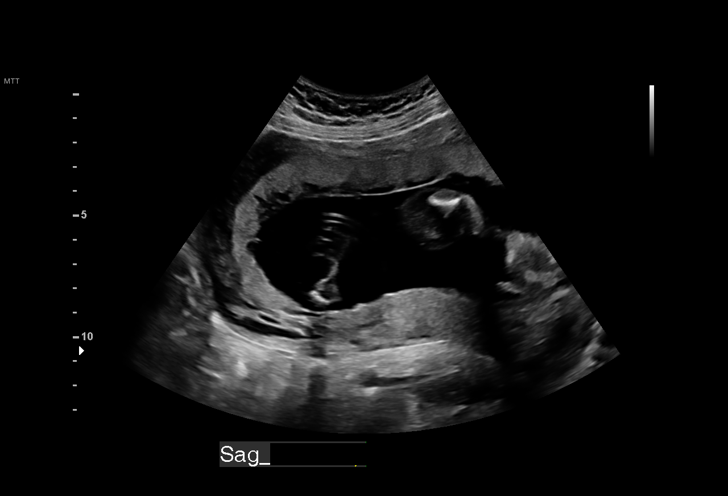
[im 82/123]
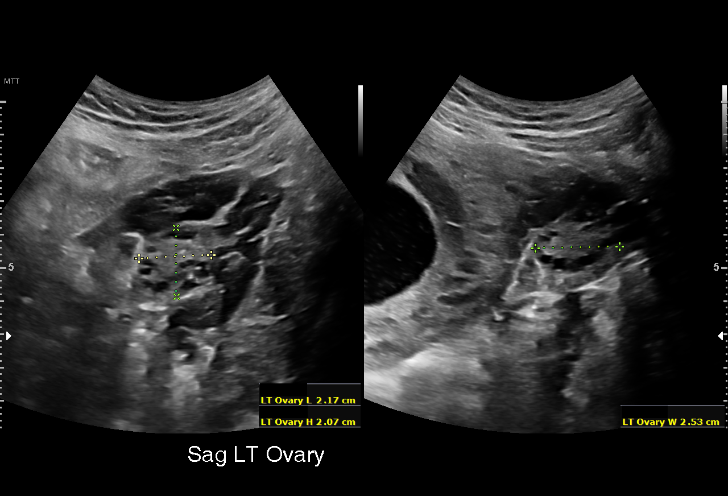
[im 91/123]
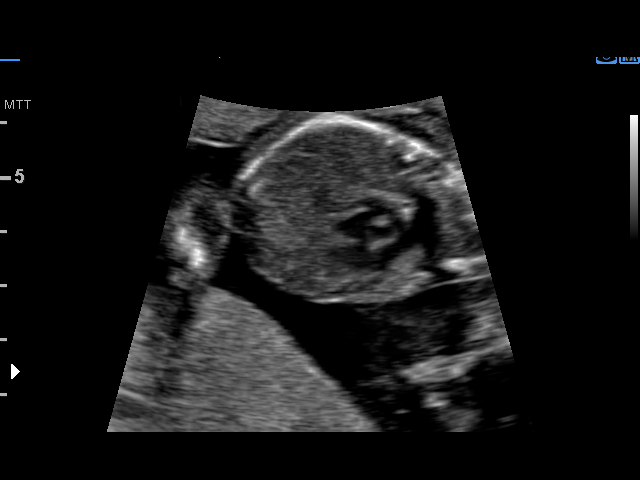
[im 100/123]
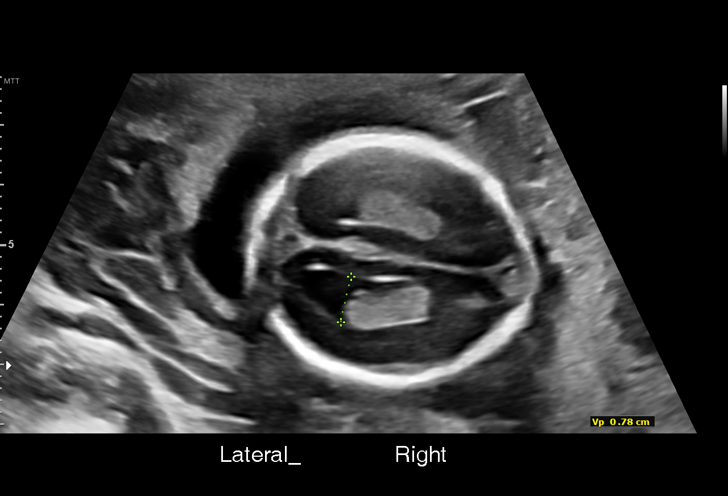
[im 109/123]
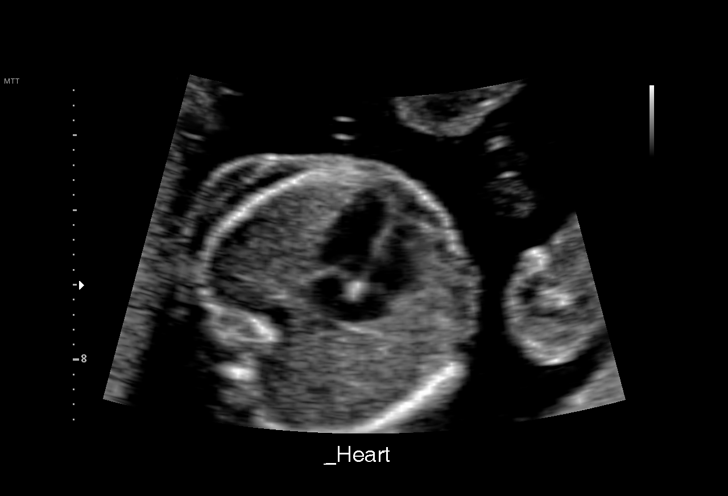
[im 118/123]
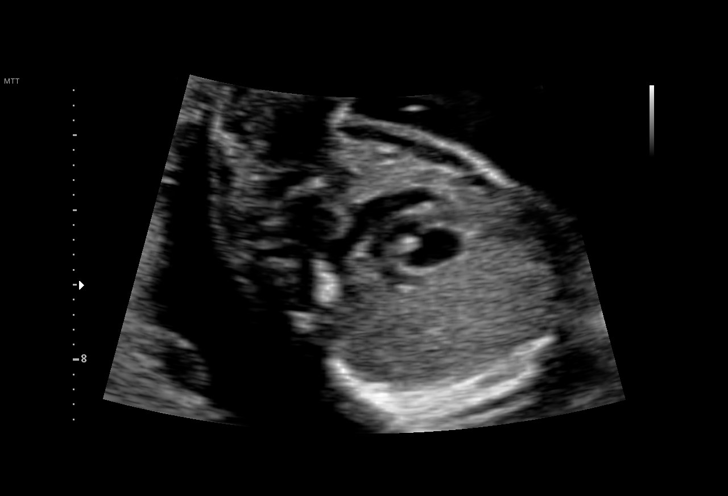

[13 of 28 positions shown; findings below may reference images not displayed]

----------------------------------------------------------------------

 ----------------------------------------------------------------------
Indications

  18 weeks gestation of pregnancy
  Encounter for antenatal screening for
  malformations
  Genetic carrier (Spinal Muscular Atrophy)
  Abnormal biochemical screen (+ Antibodies-
  unspecified)
 ----------------------------------------------------------------------
Fetal Evaluation

 Num Of Fetuses:         1
 Fetal Heart Rate(bpm):  151
 Cardiac Activity:       Observed
 Presentation:           Cephalic
 Placenta:               Anterior Fundal
 P. Cord Insertion:      Visualized, central

 Amniotic Fluid
 AFI FV:      Within normal limits

                             Largest Pocket(cm)

Biometry

 BPD:      41.7  mm     G. Age:  18w 4d         47  %    CI:        79.91   %    70 - 86
                                                         FL/HC:      20.1   %    16.1 -
 HC:      147.4  mm     G. Age:  17w 6d         10  %    HC/AC:      1.09        1.09 -
 AC:      135.5  mm     G. Age:  19w 0d         56  %    FL/BPD:     71.2   %
 FL:       29.7  mm     G. Age:  19w 1d         60  %    FL/AC:      21.9   %    20 - 24
 HUM:      28.6  mm     G. Age:  19w 2d         66  %
 CER:      18.5  mm     G. Age:  18w 2d         35  %
 NFT:       4.6  mm
 LV:        7.7  mm
 CM:        3.3  mm

 Est. FW:     266  gm      0 lb 9 oz     60  %
OB History

 Gravidity:    3         Term:   1        Prem:   0        SAB:   0
 TOP:          1       Ectopic:  0        Living: 1
Gestational Age

 LMP:           18w 5d        Date:  10/28/18                 EDD:   08/04/19
 U/S Today:     18w 5d                                        EDD:   08/04/19
 Best:          18w 5d     Det. By:  LMP  (10/28/18)          EDD:   08/04/19
Anatomy

 Cranium:               Appears normal         Aortic Arch:            Appears normal
 Cavum:                 Appears normal         Ductal Arch:            Appears normal
 Ventricles:            Appears normal         Diaphragm:              Appears normal
 Choroid Plexus:        Appears normal         Stomach:                Appears normal, left
                                                                       sided
 Cerebellum:            Appears normal         Abdomen:                Appears normal
 Posterior Fossa:       Appears normal         Abdominal Wall:         Appears nml (cord
                                                                       insert, abd wall)
 Nuchal Fold:           Appears normal         Cord Vessels:           Appears normal (3
                                                                       vessel cord)
 Face:                  Appears normal         Kidneys:                Appear normal
                        (orbits and profile)
 Lips:                  Appears normal         Bladder:                Appears normal
 Thoracic:              Appears normal         Spine:                  Appears normal
 Heart:                 Appears normal         Upper Extremities:      Appears normal
                        (4CH, axis, and
                        situs)
 RVOT:                  Appears normal         Lower Extremities:      Appears normal
 LVOT:                  Appears normal

 Other:  Male gender. Heels and Right 5th digit visualized. Technically difficult
         due to fetal position.
Cervix Uterus Adnexa

 Cervix
 Length:           3.18  cm.
 Normal appearance by transabdominal scan.

 Uterus
 No abnormality visualized.

 Left Ovary
 Within normal limits. No adnexal mass visualized.

 Right Ovary
 Within normal limits. No adnexal mass visualized.

 Cul De Sac
 No free fluid seen.
 Adnexa
 No abnormality visualized.
Impression

 Ms. Jumper, Klever3 P1, is here for fetal anatomy scan.
 She is a carrier for KARVISH1 gene (Spinal Muscular Atrophy).
 On routine screening nonspecific antibodies were found.  Her
 blood type is A positive.

 We performed fetal anatomy scan. No makers of
 aneuploidies or fetal structural defects are seen. Fetal
 biometry is consistent with her previously-established dates.
 Amniotic fluid is normal and good fetal activity is seen.
 Patient understands the limitations of ultrasound in detecting
 fetal anomalies.
 I reassured her that nonspecific antibodies are not likely to
 cause adverse fetal outcomes.
 I briefly discussed the genetics of SMA carrier status and
 recommended genetic counseling to discuss partner
 screening.
Recommendations

 -Follow-up scans as clinically indicated.
 -Genetic counseling in 2 weeks.
 -Screen for antibodies close to delivery. If positive,
 compatible blood should be available for transfusion if
 required.
                 Deboer, Ishani

## 2021-10-21 ENCOUNTER — Ambulatory Visit (INDEPENDENT_AMBULATORY_CARE_PROVIDER_SITE_OTHER): Payer: Medicaid Other

## 2021-10-21 VITALS — BP 113/65 | HR 79 | Ht 67.0 in | Wt 164.8 lb

## 2021-10-21 DIAGNOSIS — Z3480 Encounter for supervision of other normal pregnancy, unspecified trimester: Secondary | ICD-10-CM

## 2021-10-21 DIAGNOSIS — Z3481 Encounter for supervision of other normal pregnancy, first trimester: Secondary | ICD-10-CM | POA: Diagnosis not present

## 2021-10-21 DIAGNOSIS — O3680X0 Pregnancy with inconclusive fetal viability, not applicable or unspecified: Secondary | ICD-10-CM

## 2021-10-21 DIAGNOSIS — Z3A11 11 weeks gestation of pregnancy: Secondary | ICD-10-CM | POA: Diagnosis not present

## 2021-10-21 MED ORDER — BLOOD PRESSURE KIT DEVI: 1.0000 | 0 refills | Status: DC

## 2021-10-21 MED ORDER — GOJJI WEIGHT SCALE MISC: 1.0000 | 0 refills | Status: DC

## 2021-10-21 NOTE — Progress Notes (Cosign Needed)
New OB Intake  I connected with  Julie Winters Kadlec on 10/21/21 at  8:15 AM EDT by in person and verified that I am speaking with the correct person using two identifiers. Nurse is located at [facility] and pt is located at [place].  I discussed the limitations, risks, security and privacy concerns of performing an evaluation and management service by telephone and the availability of in person appointments. I also discussed with the patient that there may be a patient responsible charge related to this service. The patient expressed understanding and agreed to proceed.  I explained I am completing New OB Intake today. We discussed her EDD of 07/30/21 that is based on LMP of 07/30/21. Pt is G4/P2012. I reviewed her allergies, medications, Medical/Surgical/OB history, and appropriate screenings. I informed her of [facility] services. Based on history, this is a/an  pregnancy uncomplicated .   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   MyChart/Babyscripts MyChart access verified. I explained pt will have some visits in office and some virtually. Babyscripts instructions given and order placed. Patient verifies receipt of registration text/e-mail. Account successfully created and app downloaded.  Blood Pressure Cuff  Blood pressure cuff ordered for patient to pick-up from [employer]. Explained after first prenatal appt pt will check weekly and document in Babyscripts.  Weight scale: Patient does not  have weight scale. Weight scale ordered for  patient to pick up from [employer].   Anatomy US Explained first scheduled [place] will be around 19 weeks. Dating and viability Scheduled AFP lab only appointment if CenteringPregnancy pt for same day as anatomy US.   Labs Discussed Avelina Laine genetic screening with patient. Would like both Panorama and Horizon drawn at new OB visit.Also if interested in genetic testing, tell patient she will need AFP 15-21 weeks to complete genetic testing .Routine prenatal labs needed.  Covid Vaccine Patient has covid vaccine.   Is patient a CenteringPregnancy candidate?  Declined Declined due to Other   Not a candidate due to  Centering Patient" indicated on sticky note   Is patient interested in [facility]?  No  "Interested in [facility] - Schedule next visit with CNM" on sticky note  Informed patient of [facility] website  and placed link in her AVS.   Social Determinants of Health Food Insecurity: Patient denies food insecurity. WIC Referral: Patient is interested in referral to [facility].  Transportation: Patient denies transportation needs. Childcare: Discussed no children allowed at ultrasound appointments. Offered childcare services; patient declines childcare services at this time.  Send link to Pregnancy Navigators   Placed OB Box on problem list and updated  First visit review I reviewed new OB appt with pt. I explained she will have a pelvic exam, ob bloodwork with genetic screening, and PAP smear. Explained pt will be seen by Peggy Constant at first visit; encounter routed to appropriate provider. Explained that patient will be seen by pregnancy navigator following visit with provider. [facility] information placed in AVS.   Hamilton Capri, RN 10/21/2021  8:13 AM

## 2021-11-05 NOTE — Progress Notes (Signed)
Patient was assessed and managed by nursing staff during this encounter. I have reviewed the chart and agree with the documentation and plan. I have also made any necessary editorial changes.  Dearia Wilmouth, MD 11/05/2021 3:01 PM   

## 2021-11-06 ENCOUNTER — Ambulatory Visit (INDEPENDENT_AMBULATORY_CARE_PROVIDER_SITE_OTHER): Payer: Medicaid Other | Admitting: Obstetrics and Gynecology

## 2021-11-06 ENCOUNTER — Encounter: Payer: Self-pay | Admitting: Obstetrics and Gynecology

## 2021-11-06 ENCOUNTER — Other Ambulatory Visit (HOSPITAL_COMMUNITY)
Admission: RE | Admit: 2021-11-06 | Discharge: 2021-11-06 | Disposition: A | Discharge status: Home / Self Care | Payer: Medicaid Other | Source: Ambulatory Visit | Attending: Obstetrics and Gynecology | Admitting: Obstetrics and Gynecology

## 2021-11-06 VITALS — BP 107/62 | HR 97 | Wt 167.3 lb

## 2021-11-06 DIAGNOSIS — Z3A14 14 weeks gestation of pregnancy: Secondary | ICD-10-CM

## 2021-11-06 DIAGNOSIS — Z3480 Encounter for supervision of other normal pregnancy, unspecified trimester: Secondary | ICD-10-CM

## 2021-11-06 DIAGNOSIS — Z148 Genetic carrier of other disease: Secondary | ICD-10-CM

## 2021-11-06 DIAGNOSIS — Z3482 Encounter for supervision of other normal pregnancy, second trimester: Secondary | ICD-10-CM | POA: Diagnosis not present

## 2021-11-06 NOTE — Progress Notes (Signed)
  Subjective:    Julie Winters is a [id number] [redacted]w[redacted]d being seen today for her first obstetrical visit.  Her obstetrical history is significant for previous SVD x 2 uncomplicated. Patient does intend to breast feed. Pregnancy history fully reviewed.  Patient reports no complaints.  Vitals:   11/06/21 1555  BP: 107/62  Pulse: 97  Weight: 167 lb 4.8 oz (75.9 kg)    HISTORY: OB History  Gravida Para Term Preterm AB Living  4 2 2   1 2   SAB IAB Ectopic Multiple Live Births    1   0 2    # Outcome Date GA Lbr Len/2nd Weight Sex Delivery Anes PTL Lv  4 Current           3 Term 08/01/19 [redacted]w[redacted]d 09:32 / 00:21 6 lb 13.2 oz (3.096 kg) M Vag-Vacuum EPI  LIV  2 Term 05/04/14 [redacted]w[redacted]d 15:01 / 01:55 6 lb 8.4 oz (2.96 kg) F Vag-Spont EPI  LIV  1 IAB            Past Medical History:  Diagnosis Date  . Medical history non-contributory    Past Surgical History:  Procedure Laterality Date  . NO PAST SURGERIES     History reviewed. No pertinent family history.   Exam    Uterus:  Fundal Height: 14 cm  Pelvic Exam:    Perineum: No Hemorrhoids, Normal Perineum   Vulva: normal   Vagina:  normal mucosa, normal discharge   pH:    Cervix: multiparous appearance and cervix is closed and long   Adnexa: no mass, fullness, tenderness   Bony Pelvis: gynecoid  System: Breast:  normal appearance, no masses or tenderness   Skin: normal coloration and turgor, no rashes    Neurologic: oriented, no focal deficits   Extremities: normal strength, tone, and muscle mass   HEENT extra ocular movement intact   Mouth/Teeth mucous membranes moist, pharynx normal without lesions and dental hygiene good   Neck supple and no masses   Cardiovascular: regular rate and rhythm   Respiratory:  appears well, vitals normal, no respiratory distress, acyanotic, normal RR, chest clear, no wheezing, crepitations, rhonchi, normal symmetric air entry   Abdomen: soft, non-tender; bowel sounds normal; no masses,  no  organomegaly   Urinary:       Assessment:    Pregnancy: [redacted]w[redacted]d Patient Active Problem List   Diagnosis Date Noted  . Supervision of other normal pregnancy, antepartum 10/21/2021  . Torn earlobe 03/05/2020  . Labor and delivery, indication for care 08/01/2019  . Vacuum-assisted vaginal delivery   . 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 UTI in pregnancy, antepartum, first trimester 01/23/2019  . 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 Encounter for supervision of normal pregnancy, unspecified, unspecified trimester 01/20/2019  . Ptyalism 01/20/2019        Plan:     Initial labs drawn. Prenatal vitamins. Problem list reviewed and updated. Genetic Screening discussed : panorama declined.  Ultrasound discussed; fetal survey: ordered.  Follow up in 4 weeks. 50% of 30 min visit spent on counseling and coordination of care.     Sung Renton 11/06/2021

## 2021-11-06 NOTE — Progress Notes (Signed)
Patient presents for New OB. Intake completed on 7/10. Patient complains of having vaginal itching. No odor or abnormal discharge. No other concerns.  Last pap: 01/20/2019 Normal

## 2021-11-06 NOTE — Patient Instructions (Signed)

## 2021-11-07 LAB — CBC/D/PLT+RPR+RH+ABO+RUBIGG...
Antibody Screen: NEGATIVE
Basophils Absolute: 0 10*3/uL (ref 0.0–0.2)
Basos: 1 %
EOS (ABSOLUTE): 0.2 10*3/uL (ref 0.0–0.4)
Eos: 3 %
HCV Ab: NONREACTIVE
HIV Screen 4th Generation wRfx: NONREACTIVE
Hematocrit: 38 % (ref 34.0–46.6)
Hemoglobin: 12.9 g/dL (ref 11.1–15.9)
Hepatitis B Surface Ag: NEGATIVE
Immature Grans (Abs): 0.1 10*3/uL (ref 0.0–0.1)
Immature Granulocytes: 1 %
Lymphocytes Absolute: 1.5 10*3/uL (ref 0.7–3.1)
Lymphs: 18 %
MCH: 32.3 pg (ref 26.6–33.0)
MCHC: 33.9 g/dL (ref 31.5–35.7)
MCV: 95 fL (ref 79–97)
Monocytes Absolute: 0.7 10*3/uL (ref 0.1–0.9)
Monocytes: 8 %
Neutrophils Absolute: 5.5 10*3/uL (ref 1.4–7.0)
Neutrophils: 69 %
Platelets: 181 10*3/uL (ref 150–450)
RBC: 4 x10E6/uL (ref 3.77–5.28)
RDW: 13.1 % (ref 11.7–15.4)
RPR Ser Ql: NONREACTIVE
Rh Factor: POSITIVE
Rubella Antibodies, IGG: 4.55 index (ref >0.99)
WBC: 7.9 10*3/uL (ref 3.4–10.8)

## 2021-11-07 LAB — CERVICOVAGINAL ANCILLARY ONLY
Bacterial Vaginitis (gardnerella): POSITIVE — AB
Candida Glabrata: NEGATIVE
Candida Vaginitis: POSITIVE — AB
Chlamydia: NEGATIVE
Comment: NEGATIVE
Comment: NEGATIVE
Comment: NEGATIVE
Comment: NEGATIVE
Comment: NEGATIVE
Comment: NORMAL
Neisseria Gonorrhea: NEGATIVE
Trichomonas: NEGATIVE

## 2021-11-07 LAB — HEMOGLOBIN A1C
Est. average glucose Bld gHb Est-mCnc: 97 mg/dL
Hgb A1c MFr Bld: 5 % (ref 4.8–5.6)

## 2021-11-07 LAB — HCV INTERPRETATION

## 2021-11-08 MED ORDER — METRONIDAZOLE 500 MG PO TABS: 500.0000 mg | ORAL_TABLET | Freq: Two times a day (BID) | ORAL | 0 refills | Status: DC

## 2021-11-08 MED ORDER — TERCONAZOLE 0.8 % VA CREA: 1.0000 | TOPICAL_CREAM | Freq: Every day | VAGINAL | 0 refills | Status: DC

## 2021-11-08 NOTE — Addendum Note (Signed)
Addended by: Catalina Antigua on: 11/08/2021 05:51 AM   Modules accepted: Orders

## 2021-11-09 LAB — URINE CULTURE, OB REFLEX

## 2021-11-09 LAB — CULTURE, OB URINE

## 2021-11-13 LAB — CYTOLOGY - PAP: Diagnosis: NEGATIVE

## 2021-12-04 ENCOUNTER — Encounter (HOSPITAL_COMMUNITY): Payer: Self-pay | Admitting: Obstetrics & Gynecology

## 2021-12-04 ENCOUNTER — Ambulatory Visit (INDEPENDENT_AMBULATORY_CARE_PROVIDER_SITE_OTHER): Payer: Medicaid Other | Admitting: Family Medicine

## 2021-12-04 ENCOUNTER — Ambulatory Visit (INDEPENDENT_AMBULATORY_CARE_PROVIDER_SITE_OTHER): Payer: Medicaid Other

## 2021-12-04 ENCOUNTER — Observation Stay (HOSPITAL_COMMUNITY)
Admission: AD | Admit: 2021-12-04 | Discharge: 2021-12-06 | Disposition: A | Discharge status: Home / Self Care | Payer: Medicaid Other | Attending: Obstetrics and Gynecology | Admitting: Obstetrics and Gynecology

## 2021-12-04 ENCOUNTER — Other Ambulatory Visit: Payer: Self-pay

## 2021-12-04 VITALS — BP 103/67 | HR 98 | Wt 172.0 lb

## 2021-12-04 DIAGNOSIS — O021 Missed abortion: Principal | ICD-10-CM | POA: Diagnosis present

## 2021-12-04 DIAGNOSIS — Z3A18 18 weeks gestation of pregnancy: Secondary | ICD-10-CM

## 2021-12-04 DIAGNOSIS — Z79899 Other long term (current) drug therapy: Secondary | ICD-10-CM | POA: Insufficient documentation

## 2021-12-04 DIAGNOSIS — Z8759 Personal history of other complications of pregnancy, childbirth and the puerperium: Secondary | ICD-10-CM | POA: Diagnosis not present

## 2021-12-04 DIAGNOSIS — Z3482 Encounter for supervision of other normal pregnancy, second trimester: Secondary | ICD-10-CM

## 2021-12-04 MED ORDER — ONDANSETRON HCL 4 MG PO TABS: 4.0000 mg | ORAL_TABLET | Freq: Four times a day (QID) | ORAL | Status: DC | PRN

## 2021-12-04 MED ORDER — MISOPROSTOL 200 MCG PO TABS
200.0000 ug | ORAL_TABLET | ORAL | Status: DC
  Administered 2021-12-04 – 2021-12-05 (×3): 200 ug via VAGINAL
  Filled 2021-12-04 (×3): qty 1

## 2021-12-04 MED ORDER — HYDROMORPHONE HCL 1 MG/ML IJ SOLN
0.2000 mg | INTRAMUSCULAR | Status: DC | PRN
  Administered 2021-12-05 (×2): 0.6 mg via INTRAVENOUS
  Filled 2021-12-04 (×2): qty 1

## 2021-12-04 MED ORDER — ZOLPIDEM TARTRATE 5 MG PO TABS: 5.0000 mg | ORAL_TABLET | Freq: Every evening | ORAL | Status: DC | PRN

## 2021-12-04 MED ORDER — LACTATED RINGERS IV SOLN
INTRAVENOUS | Status: DC
  Administered 2021-12-04: 75 mL/h via INTRAVENOUS

## 2021-12-04 MED ORDER — OXYCODONE-ACETAMINOPHEN 5-325 MG PO TABS
1.0000 | ORAL_TABLET | ORAL | Status: DC | PRN
  Administered 2021-12-05: 1 via ORAL
  Filled 2021-12-04: qty 1

## 2021-12-04 MED ORDER — ONDANSETRON HCL 4 MG/2ML IJ SOLN: 4.0000 mg | Freq: Four times a day (QID) | INTRAMUSCULAR | Status: DC | PRN

## 2021-12-04 MED ORDER — FAMOTIDINE IN NACL 20-0.9 MG/50ML-% IV SOLN
20.0000 mg | Freq: Two times a day (BID) | INTRAVENOUS | Status: DC
  Administered 2021-12-05: 20 mg via INTRAVENOUS
  Filled 2021-12-04 (×2): qty 50

## 2021-12-04 MED ORDER — PRENATAL MULTIVITAMIN CH: 1.0000 | ORAL_TABLET | Freq: Every day | ORAL | Status: DC

## 2021-12-04 NOTE — H&P (Signed)
Julie Winters is an 36 y.o. female. B1D1761 77w1d[facility] no F[facility] were detected and ultrasound confirmed fetal demise. After counseling about options for expectant management vs D&E or induction she elected to come today for cytotec induction. She reports no pain or vaginal bleeding  Pertinent Gynecological History:  Last pap: normal Date: 11/06/21  OB History     Gravida  4   Para  2   Term  2   Preterm      AB  1   Living  2      SAB      IAB  1   Ectopic      Multiple  0   Live Births  2           Menstrual History:  Patient's last menstrual period was 07/30/2021.    Past Medical History:  Diagnosis Date   Medical history non-contributory     Past Surgical History:  Procedure Laterality Date   NO PAST SURGERIES      No family history on file.  Social History:  reports that she has never smoked. She has never used smokeless tobacco. She reports that she does not drink alcohol and does not use drugs.  Allergies: No Known Allergies  Medications Prior to Admission  Medication Sig Dispense Refill Last Dose   Blood Pressure Monitoring (BLOOD PRESSURE KIT) DEVI 1 kit by Does not apply route once a week. 1 each 0    Misc. Devices (GOJJI WEIGHT SCALE) MISC 1 Device by Does not apply route every 30 (thirty) days. 1 each 0    polyethylene glycol powder (GLYCOLAX/MIRALAX) 17 GM/SCOOP powder Take 17 g by mouth daily as needed. 510 g 1    Prenatal Vit-Fe Fumarate-FA (PRENATAL MULTIVITAMIN) TABS tablet Take 1 tablet by mouth daily at 12 noon.       Review of Systems  Blood pressure 123/63, pulse 81, temperature 98.8 F (37.1 C), temperature source Oral, resp. rate 18, last menstrual period 07/30/2021, SpO2 100 %, unknown if currently breastfeeding. Physical Exam Constitutional:      Appearance: Normal appearance.  HENT:     Head: Normocephalic and atraumatic.  Eyes:     Pupils: Pupils are equal, round,  and reactive to light.  Cardiovascular:     Rate and Rhythm: Normal rate.  Pulmonary:     Effort: Pulmonary effort is normal.  Abdominal:     General: Abdomen is flat.  Musculoskeletal:     Cervical back: Normal range of motion.  Skin:    General: Skin is warm and dry.  Neurological:     Mental Status: She is alert.  Psychiatric:        Mood and Affect: Mood normal.     No results found for this or any previous visit (from the past 24 hour(s)).  No results found.  Assessment/Plan: Missed abortion with fetal demise before 20 completed weeks of gestation - Absent Fetal heart beat noted for the first time in clinic today. Formal UKoreaperformed, shows fetal dates of about 15 weeks, suggesting fetus may have passed 3 weeks ago. No current abdominal pain or vaginal bleeding.  Discussed findings with patient and husband. Also discussed options for induction of labor vs scheduled D/E to evacuate products of conception. We discussed risks and benefits of each approach.    JEmeterio Reeve8/23/2023, 10:41 PM

## 2021-12-04 NOTE — Progress Notes (Signed)
ROB c/o heartburn.

## 2021-12-04 NOTE — Progress Notes (Signed)
   PRENATAL VISIT NOTE  Subjective:  Julie Winters is a 36 y.o. [id number] at [redacted]w[redacted]d being seen today for ongoing prenatal care.  She is currently monitored for the following issues for this low-risk pregnancy and has Encounter for supervision of normal pregnancy, unspecified, unspecified trimester; Ptyalism; Maternal atypical antibody; UTI in pregnancy, antepartum, first trimester; Genetic carrier status; Labor and delivery, indication for care; Vacuum-assisted vaginal delivery; Torn earlobe; and Supervision of other normal pregnancy, antepartum on their problem list.  Patient reports no complaints.  Hasn't felt baby moving much in the last couple weeks. Unsure if normal. Contractions: Not present. Vag. Bleeding: None.   . Denies leaking of fluid.   The following portions of the patient's history were reviewed and updated as appropriate: allergies, current medications, past family history, past medical history, past social history, past surgical history and problem list.   Objective:   Vitals:   12/04/21 1437  BP: 103/67  Pulse: 98  Weight: 172 lb (78 kg)    Fetal Status:         FHR not detected on doppler. Bed side US done, revealed absent fetal heart beat.  General:  Alert, oriented and cooperative. Patient is in no acute distress.  Skin: Skin is warm and dry. No rash noted.   Cardiovascular: Normal heart rate noted  Respiratory: Normal respiratory effort, no problems with respiration noted  Abdomen: Soft, gravid,  Pain/Pressure: Absent     Pelvic: Cervical exam deferred        Extremities: Normal range of motion.  Edema: None  Mental Status: Normal mood and affect. Normal behavior. Normal judgment and thought content.   Assessment and Plan:  Pregnancy: [id number] at [redacted]w[redacted]d 1. Missed abortion with fetal demise before 20 completed weeks of gestation - Absent Fetal heart beat noted for the first time in clinic today. Formal US performed, shows fetal dates of about 15 weeks,  suggesting fetus may have passed 3 weeks ago. No current abdominal pain or vaginal bleeding.  Discussed findings with patient and husband. Also discussed options for induction of labor vs scheduled D/E to evacuate products of conception. We discussed risks and benefits of each approach. Patient will like to go with induction and plans to go to the hospital for a direct admit for this.  Discussed with Dr Para March, 2nd attending for this week and Dr Debroah Loop, who was present in clinic and is on call for tonight. I also placed a call to [facility] specialty care unit to inform them of patient possibly coming In this night.   No follow-ups on file.  Future Appointments  Date Time Provider Department Center  12/11/2021  1:30 PM [facility] US3 [facility]  01/01/2022  2:30 PM Corlis Hove, NP [facility] None    Sheppard Evens MD MPH OB Fellow, Faculty Practice

## 2021-12-05 ENCOUNTER — Other Ambulatory Visit: Payer: Self-pay

## 2021-12-05 ENCOUNTER — Observation Stay (HOSPITAL_BASED_OUTPATIENT_CLINIC_OR_DEPARTMENT_OTHER): Payer: Medicaid Other | Admitting: Anesthesiology

## 2021-12-05 ENCOUNTER — Encounter (HOSPITAL_COMMUNITY)
Admission: AD | Disposition: A | Discharge status: Home / Self Care | Payer: Self-pay | Source: Home / Self Care | Attending: Obstetrics and Gynecology

## 2021-12-05 ENCOUNTER — Encounter (HOSPITAL_COMMUNITY): Payer: Self-pay | Admitting: Obstetrics and Gynecology

## 2021-12-05 ENCOUNTER — Observation Stay (HOSPITAL_COMMUNITY): Payer: Medicaid Other | Admitting: Anesthesiology

## 2021-12-05 ENCOUNTER — Observation Stay (HOSPITAL_COMMUNITY): Payer: Medicaid Other

## 2021-12-05 DIAGNOSIS — Z8759 Personal history of other complications of pregnancy, childbirth and the puerperium: Secondary | ICD-10-CM | POA: Diagnosis not present

## 2021-12-05 DIAGNOSIS — O021 Missed abortion: Secondary | ICD-10-CM | POA: Diagnosis not present

## 2021-12-05 HISTORY — PX: DILATION AND CURETTAGE OF UTERUS: SHX78

## 2021-12-05 LAB — POCT I-STAT EG7
Acid-base deficit: 14 mmol/L — ABNORMAL HIGH (ref 0.0–2.0)
Acid-base deficit: 4 mmol/L — ABNORMAL HIGH (ref 0.0–2.0)
Acid-base deficit: 6 mmol/L — ABNORMAL HIGH (ref 0.0–2.0)
Bicarbonate: 11.7 mmol/L — ABNORMAL LOW (ref 20.0–28.0)
Bicarbonate: 18.6 mmol/L — ABNORMAL LOW (ref 20.0–28.0)
Bicarbonate: 20.8 mmol/L (ref 20.0–28.0)
Calcium, Ion: 0.87 mmol/L — CL (ref 1.15–1.40)
Calcium, Ion: 1.22 mmol/L (ref 1.15–1.40)
Calcium, Ion: 1.25 mmol/L (ref 1.15–1.40)
HCT: 18 % — ABNORMAL LOW (ref 36.0–46.0)
HCT: 27 % — ABNORMAL LOW (ref 36.0–46.0)
HCT: 33 % — ABNORMAL LOW (ref 36.0–46.0)
Hemoglobin: 11.2 g/dL — ABNORMAL LOW (ref 12.0–15.0)
Hemoglobin: 6.1 g/dL — CL (ref 12.0–15.0)
Hemoglobin: 9.2 g/dL — ABNORMAL LOW (ref 12.0–15.0)
O2 Saturation: 96 %
O2 Saturation: 97 %
O2 Saturation: 99 %
Potassium: 2.3 mmol/L — CL (ref 3.5–5.1)
Potassium: 3.6 mmol/L (ref 3.5–5.1)
Potassium: 3.6 mmol/L (ref 3.5–5.1)
Sodium: 131 mmol/L — ABNORMAL LOW (ref 135–145)
Sodium: 135 mmol/L (ref 135–145)
Sodium: 135 mmol/L (ref 135–145)
TCO2: 12 mmol/L — ABNORMAL LOW (ref 22–32)
TCO2: 20 mmol/L — ABNORMAL LOW (ref 22–32)
TCO2: 22 mmol/L (ref 22–32)
pCO2, Ven: 24.1 mmHg — ABNORMAL LOW (ref 44–60)
pCO2, Ven: 33.7 mmHg — ABNORMAL LOW (ref 44–60)
pCO2, Ven: 35.9 mmHg — ABNORMAL LOW (ref 44–60)
pH, Ven: 7.293 (ref 7.25–7.43)
pH, Ven: 7.35 (ref 7.25–7.43)
pH, Ven: 7.371 (ref 7.25–7.43)
pO2, Ven: 149 mmHg — ABNORMAL HIGH (ref 32–45)
pO2, Ven: 83 mmHg — ABNORMAL HIGH (ref 32–45)
pO2, Ven: 98 mmHg — ABNORMAL HIGH (ref 32–45)

## 2021-12-05 LAB — URINALYSIS, ROUTINE W REFLEX MICROSCOPIC

## 2021-12-05 LAB — URINALYSIS, MICROSCOPIC (REFLEX): RBC / HPF: >50 RBC/hpf (ref 0–5)

## 2021-12-05 LAB — CBC WITH DIFFERENTIAL/PLATELET
Abs Immature Granulocytes: 0.1 10*3/uL — ABNORMAL HIGH (ref 0.00–0.07)
Basophils Absolute: 0 10*3/uL (ref 0.0–0.1)
Basophils Relative: 0 %
Eosinophils Absolute: 0.2 10*3/uL (ref 0.0–0.5)
Eosinophils Relative: 2 %
HCT: 33.2 % — ABNORMAL LOW (ref 36.0–46.0)
Hemoglobin: 11.4 g/dL — ABNORMAL LOW (ref 12.0–15.0)
Immature Granulocytes: 1 %
Lymphocytes Relative: 16 %
Lymphs Abs: 1.5 10*3/uL (ref 0.7–4.0)
MCH: 32.1 pg (ref 26.0–34.0)
MCHC: 34.3 g/dL (ref 30.0–36.0)
MCV: 93.5 fL (ref 80.0–100.0)
Monocytes Absolute: 0.7 10*3/uL (ref 0.1–1.0)
Monocytes Relative: 7 %
Neutro Abs: 6.8 10*3/uL (ref 1.7–7.7)
Neutrophils Relative %: 74 %
Platelets: 174 10*3/uL (ref 150–400)
RBC: 3.55 MIL/uL — ABNORMAL LOW (ref 3.87–5.11)
RDW: 14.1 % (ref 11.5–15.5)
WBC: 9.2 10*3/uL (ref 4.0–10.5)
nRBC: 0 % (ref 0.0–0.2)

## 2021-12-05 LAB — CBC
HCT: 27.6 % — ABNORMAL LOW (ref 36.0–46.0)
HCT: 32.4 % — ABNORMAL LOW (ref 36.0–46.0)
Hemoglobin: 10.9 g/dL — ABNORMAL LOW (ref 12.0–15.0)
Hemoglobin: 9.1 g/dL — ABNORMAL LOW (ref 12.0–15.0)
MCH: 31.7 pg (ref 26.0–34.0)
MCH: 31.8 pg (ref 26.0–34.0)
MCHC: 33 g/dL (ref 30.0–36.0)
MCHC: 33.6 g/dL (ref 30.0–36.0)
MCV: 94.5 fL (ref 80.0–100.0)
MCV: 96.2 fL (ref 80.0–100.0)
Platelets: 159 10*3/uL (ref 150–400)
Platelets: 174 10*3/uL (ref 150–400)
RBC: 2.87 MIL/uL — ABNORMAL LOW (ref 3.87–5.11)
RBC: 3.43 MIL/uL — ABNORMAL LOW (ref 3.87–5.11)
RDW: 14.2 % (ref 11.5–15.5)
RDW: 14.4 % (ref 11.5–15.5)
WBC: 10.8 10*3/uL — ABNORMAL HIGH (ref 4.0–10.5)
WBC: 8.8 10*3/uL (ref 4.0–10.5)
nRBC: 0 % (ref 0.0–0.2)
nRBC: 0 % (ref 0.0–0.2)

## 2021-12-05 LAB — PREPARE RBC (CROSSMATCH)

## 2021-12-05 SURGERY — DILATION AND CURETTAGE
Anesthesia: General

## 2021-12-05 MED ORDER — TRANEXAMIC ACID-NACL 1000-0.7 MG/100ML-% IV SOLN
INTRAVENOUS | Status: DC | PRN
  Administered 2021-12-05: 1000 mg via INTRAVENOUS

## 2021-12-05 MED ORDER — MIDAZOLAM HCL 2 MG/2ML IJ SOLN
INTRAMUSCULAR | Status: AC
  Filled 2021-12-05: qty 2

## 2021-12-05 MED ORDER — IBUPROFEN 600 MG PO TABS
600.0000 mg | ORAL_TABLET | Freq: Four times a day (QID) | ORAL | Status: DC
  Administered 2021-12-05 – 2021-12-06 (×4): 600 mg via ORAL
  Filled 2021-12-05 (×4): qty 1

## 2021-12-05 MED ORDER — ONDANSETRON HCL 4 MG/2ML IJ SOLN
INTRAMUSCULAR | Status: DC | PRN
  Administered 2021-12-05: 4 mg via INTRAVENOUS

## 2021-12-05 MED ORDER — ACETAMINOPHEN 325 MG PO TABS: 650.0000 mg | ORAL_TABLET | ORAL | Status: DC | PRN

## 2021-12-05 MED ORDER — TRANEXAMIC ACID-NACL 1000-0.7 MG/100ML-% IV SOLN
INTRAVENOUS | Status: AC
  Filled 2021-12-05: qty 100

## 2021-12-05 MED ORDER — STERILE WATER FOR IRRIGATION IR SOLN
Status: DC | PRN
  Administered 2021-12-05: 1000 mL

## 2021-12-05 MED ORDER — ONDANSETRON HCL 4 MG PO TABS: 4.0000 mg | ORAL_TABLET | ORAL | Status: DC | PRN

## 2021-12-05 MED ORDER — PHENYLEPHRINE HCL (PRESSORS) 10 MG/ML IV SOLN
INTRAVENOUS | Status: DC | PRN
  Administered 2021-12-05 (×3): 80 ug via INTRAVENOUS

## 2021-12-05 MED ORDER — OXYCODONE HCL 5 MG PO TABS: 5.0000 mg | ORAL_TABLET | ORAL | Status: DC | PRN

## 2021-12-05 MED ORDER — METHYLERGONOVINE MALEATE 0.2 MG/ML IJ SOLN
INTRAMUSCULAR | Status: DC | PRN
  Administered 2021-12-05: .2 mg via INTRAMUSCULAR

## 2021-12-05 MED ORDER — SODIUM CHLORIDE 0.9% IV SOLUTION: Freq: Once | INTRAVENOUS | Status: DC

## 2021-12-05 MED ORDER — FENTANYL CITRATE (PF) 250 MCG/5ML IJ SOLN
INTRAMUSCULAR | Status: DC | PRN
  Administered 2021-12-05: 50 ug via INTRAVENOUS
  Administered 2021-12-05: 100 ug via INTRAVENOUS

## 2021-12-05 MED ORDER — ONDANSETRON HCL 4 MG/2ML IJ SOLN
INTRAMUSCULAR | Status: AC
  Filled 2021-12-05: qty 2

## 2021-12-05 MED ORDER — DIPHENHYDRAMINE HCL 25 MG PO CAPS: 25.0000 mg | ORAL_CAPSULE | Freq: Four times a day (QID) | ORAL | Status: DC | PRN

## 2021-12-05 MED ORDER — SUCCINYLCHOLINE CHLORIDE 200 MG/10ML IV SOSY
PREFILLED_SYRINGE | INTRAVENOUS | Status: AC
  Filled 2021-12-05: qty 10

## 2021-12-05 MED ORDER — MIDAZOLAM HCL 5 MG/5ML IJ SOLN
INTRAMUSCULAR | Status: DC | PRN
  Administered 2021-12-05 (×2): 1 mg via INTRAVENOUS

## 2021-12-05 MED ORDER — SUCCINYLCHOLINE CHLORIDE 200 MG/10ML IV SOSY
PREFILLED_SYRINGE | INTRAVENOUS | Status: DC | PRN
  Administered 2021-12-05: 120 mg via INTRAVENOUS

## 2021-12-05 MED ORDER — SIMETHICONE 80 MG PO CHEW: 80.0000 mg | CHEWABLE_TABLET | ORAL | Status: DC | PRN

## 2021-12-05 MED ORDER — SOD CITRATE-CITRIC ACID 500-334 MG/5ML PO SOLN: 30.0000 mL | ORAL | Status: DC

## 2021-12-05 MED ORDER — SENNOSIDES-DOCUSATE SODIUM 8.6-50 MG PO TABS
2.0000 | ORAL_TABLET | Freq: Every day | ORAL | Status: DC
  Administered 2021-12-06: 2 via ORAL
  Filled 2021-12-05: qty 2

## 2021-12-05 MED ORDER — MISOPROSTOL 200 MCG PO TABS
400.0000 ug | ORAL_TABLET | ORAL | Status: DC
  Administered 2021-12-05: 400 ug via VAGINAL
  Filled 2021-12-05: qty 2

## 2021-12-05 MED ORDER — ONDANSETRON HCL 4 MG/2ML IJ SOLN: 4.0000 mg | INTRAMUSCULAR | Status: DC | PRN

## 2021-12-05 MED ORDER — DOXYCYCLINE HYCLATE 100 MG IV SOLR
200.0000 mg | INTRAVENOUS | Status: AC
  Administered 2021-12-05: 200 mg via INTRAVENOUS
  Filled 2021-12-05 (×2): qty 200

## 2021-12-05 MED ORDER — ALBUMIN HUMAN 5 % IV SOLN
INTRAVENOUS | Status: AC
  Filled 2021-12-05: qty 250

## 2021-12-05 MED ORDER — ZOLPIDEM TARTRATE 5 MG PO TABS: 5.0000 mg | ORAL_TABLET | Freq: Every evening | ORAL | Status: DC | PRN

## 2021-12-05 MED ORDER — FENTANYL CITRATE (PF) 250 MCG/5ML IJ SOLN
INTRAMUSCULAR | Status: AC
  Filled 2021-12-05: qty 5

## 2021-12-05 MED ORDER — PROPOFOL 10 MG/ML IV BOLUS
INTRAVENOUS | Status: AC
  Filled 2021-12-05: qty 20

## 2021-12-05 MED ORDER — OXYCODONE HCL 5 MG PO TABS: 10.0000 mg | ORAL_TABLET | ORAL | Status: DC | PRN

## 2021-12-05 MED ORDER — METHYLERGONOVINE MALEATE 0.2 MG/ML IJ SOLN
INTRAMUSCULAR | Status: AC
  Filled 2021-12-05: qty 1

## 2021-12-05 MED ORDER — DEXAMETHASONE SODIUM PHOSPHATE 10 MG/ML IJ SOLN
INTRAMUSCULAR | Status: AC
  Filled 2021-12-05: qty 1

## 2021-12-05 MED ORDER — ROCURONIUM BROMIDE 100 MG/10ML IV SOLN
INTRAVENOUS | Status: DC | PRN
  Administered 2021-12-05: 30 mg via INTRAVENOUS

## 2021-12-05 MED ORDER — LACTATED RINGERS IV SOLN: INTRAVENOUS | Status: DC | PRN

## 2021-12-05 MED ORDER — SUGAMMADEX SODIUM 200 MG/2ML IV SOLN
INTRAVENOUS | Status: DC | PRN
  Administered 2021-12-05: 200 mg via INTRAVENOUS

## 2021-12-05 MED ORDER — PROPOFOL 10 MG/ML IV BOLUS
INTRAVENOUS | Status: DC | PRN
  Administered 2021-12-05: 200 mg via INTRAVENOUS

## 2021-12-05 SURGICAL SUPPLY — 1 items: TUBE VACURETTE 2ND TRIMESTER (CANNULA) IMPLANT

## 2021-12-05 NOTE — Progress Notes (Signed)
Subjective: Patient resting, no pain reported.    Objective: I have reviewed patient's vital signs and medications. Blood pressure (!) 93/54, pulse 74, temperature 98 F (36.7 C), temperature source Oral, resp. rate 16, height 5\' 7"  (1.702 m), weight 78 kg, last menstrual period 07/30/2021, SpO2 99 %, unknown if currently breastfeeding.  Resting quietly   Assessment/Plan: Cytotec induction for fetal demise [redacted]w[redacted]d   LOS: 1 day    [redacted]w[redacted]d, MD 12/05/2021, 7:03 AM

## 2021-12-05 NOTE — Discharge Summary (Addendum)
Discharge Summary  Date of Service updated    Patient Name: Julie Winters DOB: 1986-03-22 MRN: [id number]  Date of admission: 12/04/2021 Delivery date: 12/05/2021 Delivering provider: Radene Gunning, MD Date of discharge: 12/06/2021  Admitting diagnosis: Fetal demise before 16 weeks with retention of dead fetus [O02.1] Intrauterine pregnancy: [redacted]w[redacted]d    Secondary diagnosis:  Principal Problem:   Fetal demise before 20 weeks with retention of dead fetus Active Problems:   Retained placenta   Additional problems: None    Discharge diagnosis:  Fetal demise, delivered                                               Post partum procedures: D&E under UKoreaguidance Complications: [id number] Hospital course: Onset of Labor With Vaginal Delivery      36y.o. yo [id number] 16w2das admitted for induction of labor with misoprostol on 12/04/2021. She delivered the fetus at 1144 on 12/05/21 after 3 doses of misoprostol. We then waited for delivery of the placenta but it was not coming and her bleeding was heavy. I attempted removal in her room but was unsuccessful. I recommended D&E in the OR under USKoreauidance and she consented. Please see operative note for details. She was given 1 unit of PRBC in PACU following the procedure due to the acute blood loss anemia causing hypotension intraoperatively. Her hemoglobin rose appropriately.  On PPD1/POD1, she is ambulating, tolerating a regular diet, passing flatus, and urinating well. She decided she would like genetics.  Initial pregnancy loss labs done as well.  Patient is discharged home in stable condition on 12/06/21.  Rhophylac:N/A MMR:N/A T-DaP: N/A Flu: N/A Transfusion:Yes - 1 unit of PRBC   Physical exam  Vitals:   12/05/21 2328 12/06/21 0338 12/06/21 0613 12/06/21 0809  BP: 110/80 (!) 82/40 (!) 93/54   Pulse: 78 74 71   Resp: _0 Temp: 98.5 F (36.9 C) 98.2 F (36.8 C)  98.3 F (36.8 C)  TempSrc: Oral Oral  Oral   SpO2: 98% 98%  99%  Weight:      Height:       General: alert, cooperative, and no distress Lochia: appropriate Uterine Fundus: firm DVT Evaluation: No evidence of DVT seen on physical exam.  Labs: Lab Results  Component Value Date   WBC 11.6 (H) 12/06/2021   HGB 10.4 (L) 12/06/2021   HCT 30.8 (L) 12/06/2021   MCV 95.1 12/06/2021   PLT 160 12/06/2021      Latest Ref Rng & Units 12/05/2021    4:18 PM  CMP  Sodium 135 - 145 mmol/L 135   Potassium 3.5 - 5.1 mmol/L 3.6    Edinburgh Score:    08/02/2019    8:35 AM  Edinburgh Postnatal Depression Scale Screening Tool  I have been able to laugh and see the funny side of things. 0  I have looked forward with enjoyment to things. 0  I have blamed myself unnecessarily when things went wrong. 0  I have been anxious or worried for no good reason. 0  I have felt scared or panicky for no good reason. 0  Things have been getting on top of me. 0  I have been so unhappy that I have had difficulty sleeping. 0  I have felt sad or miserable. 0  I have been so unhappy that I have been crying. 0  The thought of harming myself has occurred to me. 0  Edinburgh Postnatal Depression Scale Total 0     After visit meds:  Allergies as of 12/06/2021   No Known Allergies      Medication List     STOP taking these medications    Blood Pressure Kit Devi   Gojji Weight Scale Misc   polyethylene glycol powder 17 GM/SCOOP powder Commonly known as: GLYCOLAX/MIRALAX       TAKE these medications    acetaminophen 325 MG tablet Commonly known as: Tylenol Take 2 tablets (650 mg total) by mouth every 4 (four) hours as needed (for pain scale < 4).   ibuprofen 600 MG tablet Commonly known as: ADVIL Take 1 tablet (600 mg total) by mouth every 6 (six) hours.   prenatal multivitamin Tabs tablet Take 1 tablet by mouth daily at 12 noon.               Discharge Care Instructions  (From admission, onward)           Start      Ordered   12/06/21 0000  Discharge wound care:       Comments: If dressing is present, remove after 7 days from surgery   12/06/21 1025             Discharge home in stable condition  Discharge instruction: per After Visit Summary and Postpartum booklet. Activity: Advance as tolerated. Pelvic rest for 4-6 weeks.  Diet: routine diet  Future Appointments: Future Appointments  Date Time Provider [place]  01/16/2022 10:55 AM Constant, Vickii Chafe, MD [place] None   Follow up Visit:  [facility] Follow up on 01/16/2022.   Specialty: Obstetrics and Gynecology Why: 10/5 at 10:55am with Dr. Mora Bellman at [facility] information: [address] [place] [phone]                Message sent to [facility] for follow up visit on 8/24 by Dr. Radene Gunning.  Please schedule this patient for a In person postop visit in 4 weeks with the following provider: Any provider.   12/06/2021 Radene Gunning, MD

## 2021-12-05 NOTE — Progress Notes (Signed)
Came to reassess patient due to ongoing bleeding.   She was agreeable to me trying to remove placenta at bedside if present at the os. I put in the speculum and I removed a small clot from the os and the speculum then immediately filled with blood. I removed it with Fox swabs. The placenta was unable to be grasped and removed with ring forceps.   I recommended D&E in the OR given her ongoing bleeding and rate of bleeding. She seems to still be remote from delivery of the placenta. Patient agrees with plan. Reviewed consent for her and need for anesthesia. Consent reviewed and signed.   Total EBL thus far: From delivery: 488 Recheck around 2pm by RN: 376 Now during my evaluation: 136 Total EBL: 1000cc (QBL by Triton)  Vitals:   12/05/21 1255 12/05/21 1300  BP: 116/71 119/65  Pulse: 71 71  Resp:    Temp:    SpO2:     Surgery is posted. Vitals are stable. Stat CBC ordered for new baseline prior to surgery. Last check was 11pm and was 10.9.   Milas Hock, MD Attending Obstetrician & Gynecologist, [facility] for [facility], [facility]

## 2021-12-05 NOTE — Op Note (Signed)
Vaneta Owusu Heuberger PROCEDURE DATE:  12/05/2021  PREOPERATIVE DIAGNOSIS: Retained placenta with associated hemorrhage, failed attempt of extraction in room POSTOPERATIVE DIAGNOSIS: The same PROCEDURE:   Dilation and Curettage under ultrasound guidance SURGEON:  Dr. Milas Hock  INDICATIONS: 36 y.o.  [id number] with retained placenta and hemorrhage after SVD at [redacted]w[redacted]d, needing surgical completion.  Risks of surgery were discussed with the patient and her husband including but not limited to: bleeding which may require transfusion; infection which may require antibiotics; injury to uterus or surrounding organs; need for additional procedures including laparotomy or laparoscopy; possibility of intrauterine scarring which may impair future fertility; risk of retained products which may require further management and other postoperative/anesthesia complications. Written informed consent was obtained.  FINDINGS:  Placenta with moderate clots and blood.  Total EBL including delivery about 1200 liters.  QBL 1000 cc prior to surgery and EBL from surgery was 200cc.   ANESTHESIA:   General INTRAVENOUS FLUIDS:  1000 ml of LR, 250 cc of albumin ESTIMATED BLOOD LOSS:  20 ml  URINE: 200 SPECIMENS:  Placental fragments sent to pathology COMPLICATIONS:  None immediate.  PROCEDURE DETAILS:  The patient was then taken to the operating room where general anesthesia was administered and was found to be adequate.  After an adequate timeout was performed, she was placed in the dorsal lithotomy position and examined; then prepped and draped in the sterile manner. A foley catheter was placed.  A vaginal speculum was then placed in the patient's vagina and a ring forceps was applied to the anterior lip of the cervix.  The cervix was already about 2-3 cm dilated; forceps were advanced into the uterus to grasp and remove the placenta in fragments under ultrasound guidance.  A 14 mm suction curette was then advanced into the  uterus, the suction device was then activated and curette slowly rotated to clear further placental fragments.  A sharp curettage was then performed to confirm complete emptying of the uterus. There was significant bleeding noted so IM Methergine and TXA were administered.  The bleeding was noted to subside significantly.  All instruments were removed from the patient's vagina.  Sponge and instrument counts were correct times two.  The patient tolerated the procedure well and was taken to the recovery area awake and in stable condition.  Given the amount of blood loss and hypotension in the OR, patient will be observed overnight in the hospital and will receive one unit of PRBC in PACU.  Will discharge in the morning if she remains stable.     Milas Hock, MD, FACOG Attending Obstetrician & Gynecologist Faculty Practice, [facility]

## 2021-12-05 NOTE — Transfer of Care (Signed)
Immediate Anesthesia Transfer of Care Note  Patient: Julie Winters  Procedure(s) Performed: DILATATION AND CURETTAGE  Patient Location: PACU  Anesthesia Type:General  Level of Consciousness: drowsy  Airway & Oxygen Therapy: Patient Spontanous Breathing  Post-op Assessment: Report given to RN and Post -op Vital signs reviewed and stable  Post vital signs: Reviewed and stable  Last Vitals:  Vitals Value Taken Time  BP 106/64 12/05/21 1645  Temp    Pulse 88 12/05/21 1649  Resp 16 12/05/21 1649  SpO2 97 % 12/05/21 1649  Vitals shown include unvalidated device data.  Last Pain:  Vitals:   12/05/21 1645  TempSrc:   PainSc: 0-No pain      Patients Stated Pain Goal: 4 (12/05/21 1106)  Complications: No notable events documented.

## 2021-12-05 NOTE — Anesthesia Postprocedure Evaluation (Signed)
Anesthesia Post Note  Patient: Julie Winters  Procedure(s) Performed: DILATATION AND CURETTAGE     Patient location during evaluation: PACU Anesthesia Type: General Level of consciousness: awake and alert, oriented and patient cooperative Pain management: pain level controlled Vital Signs Assessment: post-procedure vital signs reviewed and stable Respiratory status: spontaneous breathing, nonlabored ventilation and respiratory function stable Cardiovascular status: blood pressure returned to baseline and stable Postop Assessment: no apparent nausea or vomiting Anesthetic complications: no   No notable events documented.  Last Vitals:  Vitals:   12/05/21 1715 12/05/21 1743  BP: 104/69 102/71  Pulse: 81 67  Resp: 16 16  Temp: 36.7 C 36.8 C  SpO2:      Last Pain:  Vitals:   12/05/21 1743  TempSrc: Oral  PainSc:    Pain Goal: Patients Stated Pain Goal: 4 (12/05/21 1106)                 Lannie Fields

## 2021-12-05 NOTE — Progress Notes (Signed)
Pt delivered non-viable fetus in the bed at 11:44. No cardiac activity noted on the fetus. Pt has not yet passed placenta, but had a moderate amount of blood and clots on the pad. Pt continued to trickle blood. Dr. Para March notified and was at bedside at 11:46.

## 2021-12-05 NOTE — Anesthesia Preprocedure Evaluation (Signed)
Anesthesia Evaluation  Patient identified by MRN, date of birth, ID band Patient awake    Reviewed: Allergy & Precautions, NPO status , Patient's Chart, lab work & pertinent test results  Airway Mallampati: II  TM Distance: >3 FB Neck ROM: Full    Dental no notable dental hx.    Pulmonary neg pulmonary ROS,    Pulmonary exam normal breath sounds clear to auscultation       Cardiovascular negative cardio ROS Normal cardiovascular exam Rhythm:Regular Rate:Normal     Neuro/Psych negative neurological ROS  negative psych ROS   GI/Hepatic negative GI ROS, Neg liver ROS,   Endo/Other  negative endocrine ROS  Renal/GU negative Renal ROS  negative genitourinary   Musculoskeletal negative musculoskeletal ROS (+)   Abdominal   Peds  Hematology  (+) Blood dyscrasia, anemia , Hb 10.9, plt 159 last labs yesterday   Anesthesia Other Findings   Reproductive/Obstetrics (+) Pregnancy 18 week fetal demise- fetus delivered around 11am, still unable to deliver placenta and has started to bleed significantly                             Anesthesia Physical Anesthesia Plan  ASA: 2  Anesthesia Plan: General   Post-op Pain Management:    Induction: Intravenous and Rapid sequence  PONV Risk Score and Plan: 4 or greater and Ondansetron, Dexamethasone and Treatment may vary due to age or medical condition  Airway Management Planned: Oral ETT  Additional Equipment: None  Intra-op Plan:   Post-operative Plan: Extubation in OR  Informed Consent: I have reviewed the patients History and Physical, chart, labs and discussed the procedure including the risks, benefits and alternatives for the proposed anesthesia with the patient or authorized representative who has indicated his/her understanding and acceptance.     Dental advisory given  Plan Discussed with: CRNA  Anesthesia Plan Comments: (D+C for  retained placenta after delivery of IUFD Will draw labs and place additional IV access after induction )        Anesthesia Quick Evaluation

## 2021-12-05 NOTE — Addendum Note (Signed)
Addendum  created 12/05/21 1807 by Renford Dills, CRNA   Flowsheet accepted

## 2021-12-05 NOTE — Anesthesia Procedure Notes (Signed)
Procedure Name: Intubation Date/Time: 12/05/2021 3:44 PM  Performed by: Renford Dills, CRNAPre-anesthesia Checklist: Patient identified, Emergency Drugs available, Suction available, Patient being monitored and Timeout performed Patient Re-evaluated:Patient Re-evaluated prior to induction Oxygen Delivery Method: Circle system utilized Preoxygenation: Pre-oxygenation with 100% oxygen Induction Type: IV induction, Rapid sequence and Cricoid Pressure applied Laryngoscope Size: Glidescope and 3 Grade View: Grade I Tube type: Oral Tube size: 7.0 mm Number of attempts: 1 Airway Equipment and Method: Stylet and Video-laryngoscopy Placement Confirmation: ETT inserted through vocal cords under direct vision, breath sounds checked- equal and bilateral and CO2 detector Tube secured with: Tape Dental Injury: Teeth and Oropharynx as per pre-operative assessment

## 2021-12-06 ENCOUNTER — Encounter (HOSPITAL_COMMUNITY): Payer: Self-pay | Admitting: *Deleted

## 2021-12-06 DIAGNOSIS — O364XX0 Maternal care for intrauterine death, not applicable or unspecified: Secondary | ICD-10-CM

## 2021-12-06 DIAGNOSIS — O021 Missed abortion: Secondary | ICD-10-CM | POA: Diagnosis not present

## 2021-12-06 LAB — CBC
HCT: 30.8 % — ABNORMAL LOW (ref 36.0–46.0)
Hemoglobin: 10.4 g/dL — ABNORMAL LOW (ref 12.0–15.0)
MCH: 32.1 pg (ref 26.0–34.0)
MCHC: 33.8 g/dL (ref 30.0–36.0)
MCV: 95.1 fL (ref 80.0–100.0)
Platelets: 160 10*3/uL (ref 150–400)
RBC: 3.24 MIL/uL — ABNORMAL LOW (ref 3.87–5.11)
RDW: 14.7 % (ref 11.5–15.5)
WBC: 11.6 10*3/uL — ABNORMAL HIGH (ref 4.0–10.5)
nRBC: 0 % (ref 0.0–0.2)

## 2021-12-06 LAB — TSH: TSH: 1.999 u[IU]/mL (ref 0.350–4.500)

## 2021-12-06 MED ORDER — IBUPROFEN 600 MG PO TABS: 600.0000 mg | ORAL_TABLET | Freq: Four times a day (QID) | ORAL | 0 refills | Status: DC

## 2021-12-06 MED ORDER — ACETAMINOPHEN 500 MG PO TABS: 500.0000 mg | ORAL_TABLET | Freq: Three times a day (TID) | ORAL | 0 refills | Status: DC | PRN

## 2021-12-06 MED ORDER — ACETAMINOPHEN 325 MG PO TABS: 650.0000 mg | ORAL_TABLET | ORAL | 0 refills | Status: DC | PRN

## 2021-12-06 MED ORDER — MENTHOL 3 MG MT LOZG
1.0000 | LOZENGE | OROMUCOSAL | Status: DC | PRN
  Administered 2021-12-06: 3 mg via ORAL
  Filled 2021-12-06: qty 9

## 2021-12-06 MED ORDER — IBUPROFEN 800 MG PO TABS: 800.0000 mg | ORAL_TABLET | Freq: Three times a day (TID) | ORAL | 0 refills | Status: DC | PRN

## 2021-12-06 NOTE — Progress Notes (Signed)
   12/06/21 1100  Clinical Encounter Type  Visited With Patient  Visit Type Follow-up  Referral From Nurse  Consult/Referral To Chaplain   Chaplain was able to follow-up her visit as promised following medical intervention on 12/05/2021. Patient requested and received prayer and will be discharged today.   Jon Gills, Resident Chaplain  [phone]

## 2021-12-07 LAB — TYPE AND SCREEN
ABO/RH(D): A POS
Antibody Screen: NEGATIVE
Unit division: 0
Unit division: 0
Unit division: 0
Unit division: 0

## 2021-12-07 LAB — BPAM RBC
Blood Product Expiration Date: 202309112359
Blood Product Expiration Date: 202309162359
Blood Product Expiration Date: 202309202359
Blood Product Expiration Date: 202309212359
ISSUE DATE / TIME: 202308241651
ISSUE DATE / TIME: 202308241651
ISSUE DATE / TIME: 202308241730
ISSUE DATE / TIME: 202308241730
Unit Type and Rh: 6200
Unit Type and Rh: 6200
Unit Type and Rh: 9500
Unit Type and Rh: 9500

## 2021-12-07 LAB — CMV IGM: CMV IgM: <30 AU/mL (ref 0.0–29.9)

## 2021-12-08 LAB — BETA-2-GLYCOPROTEIN I ABS, IGG/M/A
Beta-2 Glyco I IgG: <9 GPI IgG units (ref 0–20)
Beta-2-Glycoprotein I IgA: <9 GPI IgA units (ref 0–25)
Beta-2-Glycoprotein I IgM: <9 GPI IgM units (ref 0–32)

## 2021-12-08 LAB — CARDIOLIPIN ANTIBODIES, IGM+IGG
Anticardiolipin IgG: <9 GPL U/mL (ref 0–14)
Anticardiolipin IgM: 32 MPL U/mL — ABNORMAL HIGH (ref 0–12)

## 2021-12-09 LAB — SURGICAL PATHOLOGY

## 2021-12-10 ENCOUNTER — Telehealth: Payer: Self-pay

## 2021-12-10 LAB — LUPUS ANTICOAGULANT PANEL
DRVVT: 33.6 s (ref 0.0–47.0)
PTT Lupus Anticoagulant: 33 s (ref 0.0–43.5)

## 2021-12-10 LAB — SURGICAL PATHOLOGY

## 2021-12-10 LAB — PARVOVIRUS B19 ANTIBODY, IGG AND IGM
Parovirus B19 IgG Abs: 0.5 index (ref 0.0–0.8)
Parovirus B19 IgM Abs: 1 index — ABNORMAL HIGH (ref 0.0–0.8)

## 2021-12-10 NOTE — Telephone Encounter (Signed)
Attempted to contact about results, no answer, unable to leave vm

## 2021-12-11 ENCOUNTER — Ambulatory Visit: Payer: Medicaid Other

## 2021-12-13 ENCOUNTER — Telehealth: Payer: Self-pay

## 2021-12-13 NOTE — Telephone Encounter (Signed)
S/w pt and advised of results 

## 2021-12-23 ENCOUNTER — Encounter: Payer: Self-pay | Admitting: Obstetrics and Gynecology

## 2021-12-24 ENCOUNTER — Telehealth: Payer: Self-pay | Admitting: Obstetrics and Gynecology

## 2021-12-24 NOTE — Telephone Encounter (Signed)
Spoke with Marely and informed her of the [place] results of T21. Reviewed this would be the cause of the loss she experienced. Reviewed options should she get pregnant again for genetic testing I.e. NIPS. Answered her questions but she requested appt on Monday so her husband can be present for her follow up visit.   She also notes left arm pain that has persisted since her surgery. Reviewed I'm unsure what would cause the pain given the short and minor nature of her procedure, but I think since persisting I would recommend evaluation with her PCP. She will call their office to have it evaluated.   Milas Hock, MD Attending Obstetrician & Gynecologist, [facility] for [facility], [facility]

## 2022-01-01 ENCOUNTER — Encounter: Payer: Medicaid Other | Admitting: Student

## 2022-01-16 ENCOUNTER — Ambulatory Visit: Payer: Medicaid Other | Admitting: Obstetrics and Gynecology

## 2022-02-17 ENCOUNTER — Ambulatory Visit: Payer: Medicaid Other | Admitting: Obstetrics and Gynecology

## 2022-04-04 ENCOUNTER — Encounter: Payer: Self-pay | Admitting: Obstetrics

## 2022-04-04 ENCOUNTER — Ambulatory Visit (INDEPENDENT_AMBULATORY_CARE_PROVIDER_SITE_OTHER): Payer: Medicaid Other | Admitting: Obstetrics

## 2022-04-04 ENCOUNTER — Other Ambulatory Visit (HOSPITAL_COMMUNITY)
Admission: RE | Admit: 2022-04-04 | Discharge: 2022-04-04 | Disposition: A | Discharge status: Home / Self Care | Payer: Medicaid Other | Source: Ambulatory Visit | Attending: Obstetrics | Admitting: Obstetrics

## 2022-04-04 VITALS — BP 112/68 | HR 64 | Wt 164.0 lb

## 2022-04-04 DIAGNOSIS — Z3009 Encounter for other general counseling and advice on contraception: Secondary | ICD-10-CM | POA: Diagnosis not present

## 2022-04-04 DIAGNOSIS — Z30011 Encounter for initial prescription of contraceptive pills: Secondary | ICD-10-CM | POA: Diagnosis not present

## 2022-04-04 DIAGNOSIS — N898 Other specified noninflammatory disorders of vagina: Secondary | ICD-10-CM | POA: Diagnosis present

## 2022-04-04 DIAGNOSIS — O021 Missed abortion: Secondary | ICD-10-CM

## 2022-04-04 DIAGNOSIS — Z0000 Encounter for general adult medical examination without abnormal findings: Secondary | ICD-10-CM

## 2022-04-04 MED ORDER — ORTHO-NOVUM 1/35 (28) 1-35 MG-MCG PO TABS: 1.0000 | ORAL_TABLET | Freq: Every day | ORAL | 11 refills | Status: DC

## 2022-04-04 MED ORDER — VITAFOL ULTRA 29-0.6-0.4-200 MG PO CAPS: 1.0000 | ORAL_CAPSULE | Freq: Every day | ORAL | 4 refills | Status: AC

## 2022-04-04 NOTE — Progress Notes (Signed)
Patient ID: Julie Winters, female   DOB: 12-29-1985, 36 y.o.   MRN: [id number]  Chief Complaint  Patient presents with   Postpartum Care    HPI Julie Winters is a 36 y.o. female.  History of an IUFD at [redacted] weeks gestation.  Presents today for a 4 month  follow up.  Periods are normal.  No complaint.  Mood and affect are appropriate. HPI  Past Medical History:  Diagnosis Date   Medical history non-contributory     Past Surgical History:  Procedure Laterality Date   DILATION AND CURETTAGE OF UTERUS N/A 12/05/2021   Procedure: DILATATION AND CURETTAGE;  Surgeon: Milas Hock, MD;  Location: [facility] LD ORS;  Service: Gynecology;  Laterality: N/A;   NO PAST SURGERIES      No family history on file.  Social History Social History   Tobacco Use   Smoking status: Never   Smokeless tobacco: Never  Vaping Use   Vaping Use: Never used  Substance Use Topics   Alcohol use: No    Alcohol/week: 0.0 standard drinks of alcohol   Drug use: No    No Known Allergies  Current Outpatient Medications  Medication Sig Dispense Refill   acetaminophen (TYLENOL) 325 MG tablet Take 2 tablets (650 mg total) by mouth every 4 (four) hours as needed (for pain scale < 4). 30 tablet 0   ibuprofen (ADVIL) 600 MG tablet Take 1 tablet (600 mg total) by mouth every 6 (six) hours. 30 tablet 0   Prenatal Vit-Fe Fumarate-FA (PRENATAL MULTIVITAMIN) TABS tablet Take 1 tablet by mouth daily at 12 noon.     No current facility-administered medications for this visit.    Review of Systems Review of Systems Constitutional: negative for fatigue and weight loss Respiratory: negative for cough and wheezing Cardiovascular: negative for chest pain, fatigue and palpitations Gastrointestinal: negative for abdominal pain and change in bowel habits Genitourinary: positive for vaginal discharge Integument/breast: negative for nipple discharge Musculoskeletal:negative for myalgias Neurological: negative for gait  problems and tremors Behavioral/Psych: negative for abusive relationship, depression Endocrine: negative for temperature intolerance      Blood pressure 112/68, pulse 64, weight 164 lb (74.4 kg), last menstrual period 03/28/2022, unknown if currently breastfeeding.  Physical Exam Physical Exam General:   Alert and no distress  Skin:   no rash or abnormalities  Lungs:   clear to auscultation bilaterally  Heart:   regular rate and rhythm, S1, S2 normal, no murmur, click, rub or gallop  Pelvic Exam:  Deferred   I have spent a total of 20 minutes of face-to-face time, excluding clinical staff time, reviewing notes and preparing to see patient, ordering tests and/or medications, and counseling the patient.   Data Reviewed Labs Pathology  Assessment     1. IUFD at less than 20 weeks of gestation - doing well  2. Vaginal discharge Rx: - Cervicovaginal ancillary only( [place])  3. Encounter for other general counseling and advice on contraception - discussed options -- wants OCP's  4. Encounter for initial prescription of contraceptive pills Rx: - norethindrone-ethinyl estradiol 1/35 (ORTHO-NOVUM 1/35, 28,) tablet; Take 1 tablet by mouth daily.  Dispense: 28 tablet; Refill: 11  5. Routine adult health maintenance Rx: - Prenat-Fe Poly-Methfol-FA-DHA (VITAFOL ULTRA) 29-0.6-0.4-200 MG CAPS; Take 1 capsule by mouth daily before breakfast.  Dispense: 90 capsule; Refill: 4     Plan  Follow up   Leonette Most A. Clearance Coots MD 04/04/2022

## 2022-04-04 NOTE — Progress Notes (Signed)
Pt is in the office following an IUFD at 18.2 on 12/05/21. Pt states that she is doing a lot better today, denies signs of depressions, screening is negative. Pt states that she is unsure if she wants more children but her partner does, and she wants to discuss options for [facility] LMP 03/28/22

## 2022-04-08 LAB — CERVICOVAGINAL ANCILLARY ONLY
Bacterial Vaginitis (gardnerella): NEGATIVE
Candida Glabrata: NEGATIVE
Candida Vaginitis: NEGATIVE
Chlamydia: NEGATIVE
Comment: NEGATIVE
Comment: NEGATIVE
Comment: NEGATIVE
Comment: NEGATIVE
Comment: NEGATIVE
Comment: NORMAL
Neisseria Gonorrhea: NEGATIVE
Trichomonas: NEGATIVE

## 2022-04-14 NOTE — L&D Delivery Note (Signed)
OB/GYN Faculty Practice Delivery Note  [name] is a 37 y.o. [id number] s/p vag del at [redacted]w[redacted]d. She was admitted for IOL due to FGR.   ROM: 6h 61m with clear fluid GBS Status: neg Maximum Maternal Temperature: 98.5  Labor Progress: [name] was admitted for IOL due to FGR of 8th%; she had the usual ripening methods followed by Pitocin and AROM prior to progressing to complete and pushing with one contraction to delivery. During her labor course there were a few episodes of fetal decelerations requiring internal monitors and amnioinfusion which recovered.  Delivery Date/Time: October 9th, 2024 at 2302 Delivery: Called to room as FHR had dropped; pt was complete and was encouraged to push; with 3 pushes the head delivered LOA. Nuchal cord x 1 and body cord x 1; both tight and not reduced until after delivery.  Shoulder and body delivered in usual fashion. Infant had weak cry with stimulation after being placed on mother's abdomen. Cord clamped x 2 after close to 1-minute delay, and cut long by CNM and infant to warmer for eval. Cord blood drawn. Placenta delivered spontaneously with gentle cord traction. Fundus firm with massage and Pitocin. Labia, perineum, vagina, and cervix inspected and found to be intact.   Placenta: spont, intact; to L&D Complications: none Lacerations: none EBL: 52cc Analgesia: epidural  Postpartum Planning [x]  message to sent to schedule follow-up   Infant: girl  APGARs 6/9  2760g (6lb 1.4oz)  Julie Winters, CNM  01/21/2023 11:29 PM

## 2022-06-13 ENCOUNTER — Ambulatory Visit: Payer: Medicaid Other

## 2022-06-16 ENCOUNTER — Ambulatory Visit (INDEPENDENT_AMBULATORY_CARE_PROVIDER_SITE_OTHER): Payer: Medicaid Other

## 2022-06-16 VITALS — BP 110/76 | HR 80 | Wt 167.2 lb

## 2022-06-16 DIAGNOSIS — N926 Irregular menstruation, unspecified: Secondary | ICD-10-CM

## 2022-06-16 LAB — POCT URINE PREGNANCY: Preg Test, Ur: POSITIVE — AB

## 2022-06-16 NOTE — Progress Notes (Signed)
Julie Winters presents today for UPT. She has no unusual complaints. LMP: 04/27/22    OBJECTIVE: Appears well, in no apparent distress.  OB History     Gravida  5   Para  2   Term  2   Preterm      AB  1   Living  2      SAB      IAB  1   Ectopic      Multiple  0   Live Births  2          Home UPT Result: positive  In-Office UPT result: positive  I have reviewed the patient's medical, obstetrical, social, and family histories, and medications.   ASSESSMENT: Positive pregnancy test  PLAN Prenatal care to be completed at:  [facility] [name] [facility]

## 2022-07-07 ENCOUNTER — Other Ambulatory Visit (INDEPENDENT_AMBULATORY_CARE_PROVIDER_SITE_OTHER): Payer: Medicaid Other

## 2022-07-07 ENCOUNTER — Other Ambulatory Visit (HOSPITAL_COMMUNITY)
Admission: RE | Admit: 2022-07-07 | Discharge: 2022-07-07 | Disposition: A | Discharge status: Home / Self Care | Payer: Medicaid Other | Source: Ambulatory Visit | Attending: Obstetrics and Gynecology | Admitting: Obstetrics and Gynecology

## 2022-07-07 ENCOUNTER — Ambulatory Visit (INDEPENDENT_AMBULATORY_CARE_PROVIDER_SITE_OTHER): Payer: Medicaid Other | Admitting: *Deleted

## 2022-07-07 VITALS — BP 116/75 | HR 86 | Ht 67.0 in | Wt 166.6 lb

## 2022-07-07 DIAGNOSIS — O3680X0 Pregnancy with inconclusive fetal viability, not applicable or unspecified: Secondary | ICD-10-CM

## 2022-07-07 DIAGNOSIS — O0990 Supervision of high risk pregnancy, unspecified, unspecified trimester: Secondary | ICD-10-CM | POA: Diagnosis not present

## 2022-07-07 DIAGNOSIS — O0991 Supervision of high risk pregnancy, unspecified, first trimester: Secondary | ICD-10-CM | POA: Diagnosis not present

## 2022-07-07 DIAGNOSIS — Z1339 Encounter for screening examination for other mental health and behavioral disorders: Secondary | ICD-10-CM

## 2022-07-07 DIAGNOSIS — Z3A10 10 weeks gestation of pregnancy: Secondary | ICD-10-CM

## 2022-07-07 NOTE — Progress Notes (Signed)
New OB Intake  I connected with Julie Winters  on 07/07/22 at  3:10 PM EDT by In Person Visit and verified that I am speaking with the correct person using two identifiers. Nurse is located at [facility] and pt is located at [place].  I discussed the limitations, risks, security and privacy concerns of performing an evaluation and management service by telephone and the availability of in person appointments. I also discussed with the patient that there may be a patient responsible charge related to this service. The patient expressed understanding and agreed to proceed.  I explained I am completing New OB Intake today. We discussed EDD of 02/01/23 that is based on LMP of 04/27/22. Pt is G4/P3. I reviewed her allergies, medications, Medical/Surgical/OB history, and appropriate screenings. I informed her of [facility] services. [facility] information placed in AVS. Based on history, this is a high risk pregnancy.  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 Patient given information for [facility] Healthy Baby website for more information about Women's and [place]. Patient is interested in water birth. Offered upcoming OB visit with CNM to discuss further.  MyChart/Babyscripts MyChart access verified. I explained pt will have some visits in office and some virtually. Babyscripts instructions  given and order placed. Patient verifies receipt of registration text/e-mail. Account successfully created and app downloaded.  Blood Pressure Cuff/Weight Scale Pt has BP cuff at home. Explained after first prenatal appt pt will check weekly and document in 15. Patient does not have weight scale; patient may purchase if they desire to track weight weekly in Babyscripts.  Anatomy US Explained first scheduled [place] will be around 19 weeks. Anatomy US scheduled for 19 wks at [facility]. Pt notified to arrive at TBD.  Labs Discussed Johnsie Cancel genetic screening with patient. Would like both Panorama and Horizon drawn at new OB visit. Routine prenatal labs needed.  COVID Vaccine Patient has had COVID vaccine.   Social Determinants of Health Food Insecurity: Patient denies food insecurity. WIC Referral: Patient is interested in referral to [facility].  Transportation: Patient denies transportation needs. Childcare: Discussed no children allowed at ultrasound appointments. Offered childcare services; patient declines childcare services at this time.  Interested in [place]? If yes, send referral and doula dot phrase.   First visit review I reviewed new OB appt with patient. I explained they will have a provider visit that includes discussion. Explained pt will be seen by Dr. Elgie Congo at first visit; encounter routed to appropriate provider. Explained that patient will be seen by pregnancy navigator following visit with provider.   Penny Pia, RN 07/07/2022  3:10 PM

## 2022-07-08 LAB — CBC/D/PLT+RPR+RH+ABO+RUBIGG...
Antibody Screen: NEGATIVE
Basophils Absolute: 0 10*3/uL (ref 0.0–0.2)
Basos: 0 %
EOS (ABSOLUTE): 0.1 10*3/uL (ref 0.0–0.4)
Eos: 1 %
HCV Ab: NONREACTIVE
HIV Screen 4th Generation wRfx: NONREACTIVE
Hematocrit: 35.6 % (ref 34.0–46.6)
Hemoglobin: 11.8 g/dL (ref 11.1–15.9)
Hepatitis B Surface Ag: NEGATIVE
Immature Grans (Abs): 0 10*3/uL (ref 0.0–0.1)
Immature Granulocytes: 0 %
Lymphocytes Absolute: 1.4 10*3/uL (ref 0.7–3.1)
Lymphs: 21 %
MCH: 30.4 pg (ref 26.6–33.0)
MCHC: 33.1 g/dL (ref 31.5–35.7)
MCV: 92 fL (ref 79–97)
Monocytes Absolute: 0.5 10*3/uL (ref 0.1–0.9)
Monocytes: 8 %
Neutrophils Absolute: 4.5 10*3/uL (ref 1.4–7.0)
Neutrophils: 70 %
Platelets: 175 10*3/uL (ref 150–450)
RBC: 3.88 x10E6/uL (ref 3.77–5.28)
RDW: 13.9 % (ref 11.7–15.4)
RPR Ser Ql: NONREACTIVE
Rh Factor: POSITIVE
Rubella Antibodies, IGG: 4.39 index (ref >0.99)
WBC: 6.4 10*3/uL (ref 3.4–10.8)

## 2022-07-08 LAB — HCV INTERPRETATION

## 2022-07-09 ENCOUNTER — Telehealth: Payer: Self-pay

## 2022-07-09 LAB — CULTURE, OB URINE

## 2022-07-09 LAB — URINE CULTURE, OB REFLEX

## 2022-07-09 NOTE — Telephone Encounter (Signed)
Attempted to contact and notify about normal labs, no answer, unable to leave vm

## 2022-07-11 LAB — CERVICOVAGINAL ANCILLARY ONLY
Chlamydia: NEGATIVE
Comment: NEGATIVE
Comment: NORMAL
Neisseria Gonorrhea: NEGATIVE

## 2022-07-16 LAB — PANORAMA PRENATAL TEST FULL PANEL:PANORAMA TEST PLUS 5 ADDITIONAL MICRODELETIONS: FETAL FRACTION: 16.2

## 2022-07-17 ENCOUNTER — Other Ambulatory Visit: Payer: Self-pay

## 2022-07-17 DIAGNOSIS — O0990 Supervision of high risk pregnancy, unspecified, unspecified trimester: Secondary | ICD-10-CM

## 2022-07-17 MED ORDER — PANTOPRAZOLE SODIUM 40 MG PO TBEC: 40.0000 mg | DELAYED_RELEASE_TABLET | Freq: Every day | ORAL | 3 refills | Status: DC

## 2022-07-28 ENCOUNTER — Ambulatory Visit (INDEPENDENT_AMBULATORY_CARE_PROVIDER_SITE_OTHER): Payer: Medicaid Other | Admitting: Licensed Clinical Social Worker

## 2022-07-28 ENCOUNTER — Ambulatory Visit: Payer: Medicaid Other | Admitting: Obstetrics and Gynecology

## 2022-07-28 VITALS — BP 100/64 | HR 89 | Wt 173.0 lb

## 2022-07-28 DIAGNOSIS — O09299 Supervision of pregnancy with other poor reproductive or obstetric history, unspecified trimester: Secondary | ICD-10-CM

## 2022-07-28 DIAGNOSIS — K117 Disturbances of salivary secretion: Secondary | ICD-10-CM | POA: Diagnosis not present

## 2022-07-28 DIAGNOSIS — Z148 Genetic carrier of other disease: Secondary | ICD-10-CM

## 2022-07-28 DIAGNOSIS — Z8759 Personal history of other complications of pregnancy, childbirth and the puerperium: Secondary | ICD-10-CM

## 2022-07-28 DIAGNOSIS — O0991 Supervision of high risk pregnancy, unspecified, first trimester: Secondary | ICD-10-CM | POA: Diagnosis not present

## 2022-07-28 DIAGNOSIS — O09529 Supervision of elderly multigravida, unspecified trimester: Secondary | ICD-10-CM | POA: Insufficient documentation

## 2022-07-28 DIAGNOSIS — O09292 Supervision of pregnancy with other poor reproductive or obstetric history, second trimester: Secondary | ICD-10-CM | POA: Insufficient documentation

## 2022-07-28 DIAGNOSIS — O09521 Supervision of elderly multigravida, first trimester: Secondary | ICD-10-CM

## 2022-07-28 DIAGNOSIS — Z1339 Encounter for screening examination for other mental health and behavioral disorders: Secondary | ICD-10-CM

## 2022-07-28 DIAGNOSIS — O0990 Supervision of high risk pregnancy, unspecified, unspecified trimester: Secondary | ICD-10-CM

## 2022-07-28 DIAGNOSIS — Z3A13 13 weeks gestation of pregnancy: Secondary | ICD-10-CM

## 2022-07-28 DIAGNOSIS — O09291 Supervision of pregnancy with other poor reproductive or obstetric history, first trimester: Secondary | ICD-10-CM

## 2022-07-28 HISTORY — DX: Supervision of pregnancy with other poor reproductive or obstetric history, unspecified trimester: O09.299

## 2022-07-28 MED ORDER — GLYCOPYRROLATE 2 MG PO TABS: 2.0000 mg | ORAL_TABLET | Freq: Three times a day (TID) | ORAL | 3 refills | Status: DC | PRN

## 2022-07-28 NOTE — Progress Notes (Signed)
Pt states she needs Rx for spitting.

## 2022-07-28 NOTE — Progress Notes (Signed)
INITIAL PRENATAL VISIT NOTE  Subjective:  Julie Winters is a 37 y.o. [id number] at [redacted]w[redacted]d by LMP c/w ultrasound being seen today for her initial prenatal visit. She has an obstetric history significant for SVD x 3 and IUFD. She has an uncomplicated medical history .  Patient reports  continuous spitting .  Contractions: Not present. Vag. Bleeding: None.   . Denies leaking of fluid.    Past Medical History:  Diagnosis Date   Medical history non-contributory     Past Surgical History:  Procedure Laterality Date   DILATION AND CURETTAGE OF UTERUS N/A 12/05/2021   Procedure: DILATATION AND CURETTAGE;  Surgeon: Milas Hock, MD;  Location: [facility];  Service: Gynecology;  Laterality: N/A;   NO PAST SURGERIES      OB History  Gravida Para Term Preterm AB Living  4 3 2 1  0 2  SAB IAB Ectopic Multiple Live Births    0   0 2    # Outcome Date GA Lbr Len/2nd Weight Sex Delivery Anes PTL Lv  4 Current           3 Preterm 12/05/21 [redacted]w[redacted]d    Vag-Spont   FD  2 Term 08/01/19 [redacted]w[redacted]d 09:32 / 00:21 6 lb 13.2 oz (3.096 kg) M Vag-Vacuum EPI  LIV  1 Term 05/04/14 [redacted]w[redacted]d 15:01 / 01:55 6 lb 8.4 oz (2.96 kg) F Vag-Spont EPI  LIV    Social History   Socioeconomic History   Marital status: Married    Spouse name: Not on file   Number of children: Not on file   Years of education: Not on file   Highest education level: Not on file  Occupational History   Not on file  Tobacco Use   Smoking status: Never    Passive exposure: Never   Smokeless tobacco: Never  Vaping Use   Vaping Use: Never used  Substance and Sexual Activity   Alcohol use: No    Alcohol/week: 0.0 standard drinks of alcohol   Drug use: No   Sexual activity: Yes    Partners: Male    Birth control/protection: None  Other Topics Concern   Not on file  Social History Narrative   Not on file   Social Determinants of Health   Financial Resource Strain: Not on file  Food Insecurity: Not on file  Transportation Needs:  Not on file  Physical Activity: Not on file  Stress: Not on file  Social Connections: Not on file    No family history on file.   Current Outpatient Medications:    glycopyrrolate (ROBINUL) 2 MG tablet, Take 1 tablet (2 mg total) by mouth 3 (three) times daily as needed., Disp: 30 tablet, Rfl: 3   pantoprazole (PROTONIX) 40 MG tablet, Take 1 tablet (40 mg total) by mouth daily., Disp: 60 tablet, Rfl: 3   Prenat-Fe Poly-Methfol-FA-DHA (VITAFOL ULTRA) 29-0.6-0.4-200 MG CAPS, Take 1 capsule by mouth daily before breakfast., Disp: 90 capsule, Rfl: 4   acetaminophen (TYLENOL) 325 MG tablet, Take 2 tablets (650 mg total) by mouth every 4 (four) hours as needed (for pain scale < 4). (Patient not taking: Reported on 06/16/2022), Disp: 30 tablet, Rfl: 0   ibuprofen (ADVIL) 600 MG tablet, Take 1 tablet (600 mg total) by mouth every 6 (six) hours., Disp: 30 tablet, Rfl: 0   Prenatal Vit-Fe Fumarate-FA (PRENATAL MULTIVITAMIN) TABS tablet, Take 1 tablet by mouth daily at 12 noon., Disp: , Rfl:    Prenatal-FeFum-FA-DHA w/o A (PRENATAL +  DHA PO), Take by mouth. OTC, Disp: , Rfl:   No Known Allergies  Review of Systems: Negative except for what is mentioned in HPI.  Objective:   Vitals:   07/28/22 1402  BP: 100/64  Pulse: 89  Weight: 173 lb (78.5 kg)    Fetal Status: Fetal Heart Rate (bpm): 155         Physical Exam: BP 100/64   Pulse 89   Wt 173 lb (78.5 kg)   LMP 04/27/2022 (Exact Date)   BMI 27.10 kg/m  CONSTITUTIONAL: Well-developed, well-nourished female in no acute distress.  NEUROLOGIC: Alert and oriented to person, place, and time. Normal reflexes, muscle tone coordination. No cranial nerve deficit noted. PSYCHIATRIC: Normal mood and affect. Normal behavior. Normal judgment and thought content. SKIN: Skin is warm and dry. No rash noted. Not diaphoretic. No erythema. No pallor. HENT:  Normocephalic, atraumatic, External right and left ear normal. Oropharynx is clear and moist EYES:  Conjunctivae and EOM are normal.  NECK: Normal range of motion, supple, no masses CARDIOVASCULAR: Normal heart rate noted, regular rhythm RESPIRATORY: Effort and breath sounds normal, no problems with respiration noted BREASTS: deferred ABDOMEN: Soft, nontender, nondistended, gravid. GU: deferred today MUSCULOSKELETAL: Normal range of motion. EXT:  No edema and no tenderness. 2+ distal pulses.   Assessment and Plan:  Pregnancy: G4P2102 at [redacted]w[redacted]d by LMP  1. [redacted] weeks gestation of pregnancy   2. Ptyalism Trial of robinul to address spitting  - glycopyrrolate (ROBINUL) 2 MG tablet; Take 1 tablet (2 mg total) by mouth 3 (three) times daily as needed.  Dispense: 30 tablet; Refill: 3  3. Supervision of high risk pregnancy, antepartum Continue routine prenatal care  4. History of IUFD Consider possible increased monitoring at 30-32 weeks, last loss was at 18.2 weeks  5. 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 Multigravida of advanced maternal age in first trimester   7. History of delivery by vacuum extraction, currently pregnant    Preterm labor symptoms and general obstetric precautions including but not limited to vaginal bleeding, contractions, leaking of fluid and fetal movement were reviewed in detail with the patient.  Please refer to After Visit Summary for other counseling recommendations.   Return in about 4 weeks (around 08/25/2022) for ROB, in person.  Warden Fillers 07/28/2022 2:54 PM

## 2022-07-28 NOTE — Patient Instructions (Signed)

## 2022-07-29 NOTE — BH Specialist Note (Signed)
Integrated Behavioral Health Initial In-Person Visit  MRN: [id number] Name: Ellamae Lybeck  Number of Integrated Behavioral Health Clinician visits: 1 Session Start time:   2:00pm Session End time: 2:15pm Total time in minutes: 15 mins in person at [facility]   Types of Service: General Behavioral Integrated Care (BHI)  Interpretor:No. Interpretor Name and Language: none   Warm Hand Off Completed.        Subjective: Artisha Capri is a 37 y.o. female accompanied by n/a Patient was referred by Dr Donavan Foil for new ob intro. Patient reports the following symptoms/concerns: no reported concerns  Duration of problem: n/a; Severity of problem: n/a  Objective: Mood: good and Affect: Appropriate Risk of harm to self or others: No plan to harm self or others  Life Context: Family and Social: live with spouse and two children  School/Work: n/a Self-Care: n/a Life Changes: new pregnancy  Patient and/or Family's Strengths/Protective Factors: Concrete supports in place (healthy food, safe environments, etc.)  Goals Addressed: Patient will: Take prenatal vitamins  Attend prenatal appt  Reduce stress   Progress towards Goals: Ongoing  Interventions: Interventions utilized: Psychoeducation and/or Health Education  Standardized Assessments completed: PHQ 9     Gwyndolyn Saxon, LCSW

## 2022-08-05 ENCOUNTER — Encounter (HOSPITAL_COMMUNITY): Payer: Self-pay | Admitting: Obstetrics & Gynecology

## 2022-08-05 ENCOUNTER — Telehealth: Payer: Self-pay

## 2022-08-05 ENCOUNTER — Inpatient Hospital Stay (HOSPITAL_COMMUNITY)
Admission: AD | Admit: 2022-08-05 | Discharge: 2022-08-05 | Disposition: A | Discharge status: Home / Self Care | Payer: Medicaid Other | Attending: Obstetrics & Gynecology | Admitting: Obstetrics & Gynecology

## 2022-08-05 DIAGNOSIS — R102 Pelvic and perineal pain unspecified side: Secondary | ICD-10-CM

## 2022-08-05 DIAGNOSIS — O26892 Other specified pregnancy related conditions, second trimester: Secondary | ICD-10-CM | POA: Diagnosis not present

## 2022-08-05 DIAGNOSIS — O09292 Supervision of pregnancy with other poor reproductive or obstetric history, second trimester: Secondary | ICD-10-CM | POA: Diagnosis not present

## 2022-08-05 DIAGNOSIS — Z3A14 14 weeks gestation of pregnancy: Secondary | ICD-10-CM

## 2022-08-05 DIAGNOSIS — O26899 Other specified pregnancy related conditions, unspecified trimester: Secondary | ICD-10-CM

## 2022-08-05 DIAGNOSIS — O09299 Supervision of pregnancy with other poor reproductive or obstetric history, unspecified trimester: Secondary | ICD-10-CM

## 2022-08-05 LAB — URINALYSIS, ROUTINE W REFLEX MICROSCOPIC
Bilirubin Urine: NEGATIVE
Glucose, UA: NEGATIVE mg/dL
Hgb urine dipstick: NEGATIVE
Ketones, ur: NEGATIVE mg/dL
Leukocytes,Ua: NEGATIVE
Nitrite: NEGATIVE
Protein, ur: NEGATIVE mg/dL
Specific Gravity, Urine: 1.015 (ref 1.005–1.030)
pH: 5 (ref 5.0–8.0)

## 2022-08-05 NOTE — MAU Provider Note (Signed)
History     CSN: [id number]  Arrival date and time: 08/05/22 7829   Event Date/Time   First Provider Initiated Contact with Patient 08/05/22 [phone]      Chief Complaint  Patient presents with   Abdominal Pain   HPI  Julie Winters is a 37 y.o. [id number] at [redacted]w[redacted]d who presents for evaluation of abdominal pain. Patient reports lower abdominal cramping over night which was severe. Since her arrival to MAU, she reports the pain is lessening and less frequent. Patient rates the pain as a 4/10 and has not tried anything for the pain. She has a hx of 18 week loss last year and is very nervous.  She denies any vaginal bleeding, discharge, and leaking of fluid. Denies any constipation, diarrhea or any urinary complaints.   OB History     Gravida  4   Para  3   Term  2   Preterm  1   AB  0   Living  2      SAB      IAB  0   Ectopic      Multiple  0   Live Births  2           Past Medical History:  Diagnosis Date   Medical history non-contributory     Past Surgical History:  Procedure Laterality Date   DILATION AND CURETTAGE OF UTERUS N/A 12/05/2021   Procedure: DILATATION AND CURETTAGE;  Surgeon: Milas Hock, MD;  Location: [facility];  Service: Gynecology;  Laterality: N/A;   NO PAST SURGERIES      History reviewed. No pertinent family history.  Social History   Tobacco Use   Smoking status: Never    Passive exposure: Never   Smokeless tobacco: Never  Vaping Use   Vaping Use: Never used  Substance Use Topics   Alcohol use: No    Alcohol/week: 0.0 standard drinks of alcohol   Drug use: No    Allergies: No Known Allergies  Medications Prior to Admission  Medication Sig Dispense Refill Last Dose   Prenatal Vit-Fe Fumarate-FA (PRENATAL MULTIVITAMIN) TABS tablet Take 1 tablet by mouth daily at 12 noon.   08/04/2022   Prenatal-FeFum-FA-DHA w/o A (PRENATAL + DHA PO) Take by mouth. OTC   08/04/2022   acetaminophen (TYLENOL) 325 MG tablet Take 2  tablets (650 mg total) by mouth every 4 (four) hours as needed (for pain scale < 4). (Patient not taking: Reported on 06/16/2022) 30 tablet 0    glycopyrrolate (ROBINUL) 2 MG tablet Take 1 tablet (2 mg total) by mouth 3 (three) times daily as needed. 30 tablet 3    ibuprofen (ADVIL) 600 MG tablet Take 1 tablet (600 mg total) by mouth every 6 (six) hours. 30 tablet 0    pantoprazole (PROTONIX) 40 MG tablet Take 1 tablet (40 mg total) by mouth daily. 60 tablet 3    Prenat-Fe Poly-Methfol-FA-DHA (VITAFOL ULTRA) 29-0.6-0.4-200 MG CAPS Take 1 capsule by mouth daily before breakfast. 90 capsule 4     Review of Systems  Constitutional: Negative.  Negative for fatigue and fever.  HENT: Negative.    Respiratory: Negative.  Negative for shortness of breath.   Cardiovascular: Negative.  Negative for chest pain.  Gastrointestinal:  Positive for abdominal pain. Negative for constipation, diarrhea, nausea and vomiting.  Genitourinary: Negative.  Negative for dysuria, vaginal bleeding and vaginal discharge.  Neurological: Negative.  Negative for dizziness and headaches.   Physical Exam   Blood pressure  130/63, pulse 86, temperature 98.4 F (36.9 C), temperature source Oral, resp. rate 16, height  (1.702 m), last menstrual period 04/27/2022, SpO2 100 %, unknown if currently breastfeeding.  Patient Vitals for the past 24 hrs:  BP Temp Temp src Pulse Resp SpO2 Height  08/05/22 0911 130/63 98.4 F (36.9 C) Oral 86 16 100 %  (1.702 m)    Physical Exam Vitals and nursing note reviewed.  Constitutional:      General: She is not in acute distress.    Appearance: She is well-developed.  HENT:     Head: Normocephalic.  Eyes:     Pupils: Pupils are equal, round, and reactive to light.  Cardiovascular:     Rate and Rhythm: Normal rate and regular rhythm.     Heart sounds: Normal heart sounds.  Pulmonary:     Effort: Pulmonary effort is normal. No respiratory distress.     Breath sounds: Normal  breath sounds.  Abdominal:     General: Bowel sounds are normal. There is no distension.     Palpations: Abdomen is soft.     Tenderness: There is no abdominal tenderness.  Skin:    General: Skin is warm and dry.  Neurological:     Mental Status: She is alert and oriented to person, place, and time.  Psychiatric:        Mood and Affect: Mood normal.        Behavior: Behavior normal.        Thought Content: Thought content normal.        Judgment: Judgment normal.     FHT: 152  Dilation: Closed Exam by:: Lake Bells CNM   MAU Course  Procedures  Results for orders placed or performed during the hospital encounter of 08/05/22 (from the past 24 hour(s))  Urinalysis, Routine w reflex microscopic -Urine, Clean Catch     Status: Abnormal   Collection Time: 08/05/22  9:20 AM  Result Value Ref Range   Color, Urine YELLOW YELLOW   APPearance HAZY (A) CLEAR   Specific Gravity, Urine 1.015 1.005 - 1.030   pH 5.0 5.0 - 8.0   Glucose, UA NEGATIVE NEGATIVE mg/dL   Hgb urine dipstick NEGATIVE NEGATIVE   Bilirubin Urine NEGATIVE NEGATIVE   Ketones, ur NEGATIVE NEGATIVE mg/dL   Protein, ur NEGATIVE NEGATIVE mg/dL   Nitrite NEGATIVE NEGATIVE   Leukocytes,Ua NEGATIVE NEGATIVE    MDM Labs ordered and reviewed.   UA Offered pain medication and patient declines. States she does not like to take medication in pregnancy.   Assessment and Plan   1. Pain of round ligament during pregnancy   2. [redacted] weeks gestation of pregnancy   3. Hx of intrauterine fetal death, currently pregnant    -Discharge home in stable condition -second trimester precautions discussed -Patient advised to follow-up with OB as scheduled for prenatal care -Patient may return to MAU as needed or if her condition were to change or worsen  Rolm Bookbinder, CNM 08/05/2022, 9:39 AM

## 2022-08-05 NOTE — MAU Note (Signed)
.  Julie Winters is a 37 y.o. at [redacted]w[redacted]d here in MAU reporting: lower abdominal cramping that started around 0000. She states it comes and goes. Denies abnormal discharge or VB. Has not had anything for the pain.  Pain score: 7 Vitals:   08/05/22 0911  BP: 130/63  Pulse: 86  Resp: 16  Temp: 98.4 F (36.9 C)  SpO2: 100%     FHT:152 Lab orders placed from triage:  UA

## 2022-08-05 NOTE — Discharge Instructions (Signed)

## 2022-08-05 NOTE — Telephone Encounter (Signed)
Patient called in distress reports she can not walk due to "excruciating" bilateral low abdominal pain. Patient agrees to report to MAU for an evaluation. Hx of IUFD Aug. 2023.

## 2022-08-25 ENCOUNTER — Ambulatory Visit (INDEPENDENT_AMBULATORY_CARE_PROVIDER_SITE_OTHER): Payer: Medicaid Other | Admitting: Obstetrics and Gynecology

## 2022-08-25 ENCOUNTER — Ambulatory Visit (INDEPENDENT_AMBULATORY_CARE_PROVIDER_SITE_OTHER): Payer: Medicaid Other | Admitting: Licensed Clinical Social Worker

## 2022-08-25 ENCOUNTER — Encounter: Payer: Self-pay | Admitting: Obstetrics and Gynecology

## 2022-08-25 VITALS — BP 98/65 | HR 77 | Wt 172.0 lb

## 2022-08-25 DIAGNOSIS — O0990 Supervision of high risk pregnancy, unspecified, unspecified trimester: Secondary | ICD-10-CM

## 2022-08-25 DIAGNOSIS — Z8759 Personal history of other complications of pregnancy, childbirth and the puerperium: Secondary | ICD-10-CM

## 2022-08-25 DIAGNOSIS — F4320 Adjustment disorder, unspecified: Secondary | ICD-10-CM | POA: Diagnosis not present

## 2022-08-25 DIAGNOSIS — O09522 Supervision of elderly multigravida, second trimester: Secondary | ICD-10-CM

## 2022-08-25 MED ORDER — ASPIRIN 81 MG PO TBEC: 81.0000 mg | DELAYED_RELEASE_TABLET | Freq: Every day | ORAL | 2 refills | Status: DC

## 2022-08-25 NOTE — Progress Notes (Signed)
   PRENATAL VISIT NOTE  Subjective:  Julie Winters is a 37 y.o. [id number] at [redacted]w[redacted]d being seen today for ongoing prenatal care.  She is currently monitored for the following issues for this high-risk pregnancy and has Maternal atypical antibody; Genetic carrier status; Retained placenta; Supervision of high risk pregnancy, antepartum; History of IUFD; Advanced maternal age in multigravida; and History of delivery by vacuum extraction, currently pregnant on their problem list.  Patient reports no complaints.  Contractions: Not present. Vag. Bleeding: None.   . Denies leaking of fluid.   The following portions of the patient's history were reviewed and updated as appropriate: allergies, current medications, past family history, past medical history, past social history, past surgical history and problem list.   Objective:   Vitals:   08/25/22 1435  BP: 98/65  Pulse: 77  Weight: 172 lb (78 kg)    Fetal Status: Fetal Heart Rate (bpm): 145         General:  Alert, oriented and cooperative. Patient is in no acute distress.  Skin: Skin is warm and dry. No rash noted.   Cardiovascular: Normal heart rate noted  Respiratory: Normal respiratory effort, no problems with respiration noted  Abdomen: Soft, gravid, appropriate for gestational age.  Pain/Pressure: Absent     Pelvic: Cervical exam deferred        Extremities: Normal range of motion.     Mental Status: Normal mood and affect. Normal behavior. Normal judgment and thought content.   Assessment and Plan:  Pregnancy: [id number] at [redacted]w[redacted]d 1. Supervision of high risk pregnancy, antepartum Patient is doing well without complaints AFP today Anatomy ultrasound on 5/28  2. Multigravida of advanced maternal age in second trimester Rx ASA provided  3. History of IUFD   Preterm labor symptoms and general obstetric precautions including but not limited to vaginal bleeding, contractions, leaking of fluid and fetal movement were reviewed in  detail with the patient. Please refer to After Visit Summary for other counseling recommendations.   Return in about 4 weeks (around 09/22/2022) for in person, ROB, High risk.  Future Appointments  Date Time Provider Department Center  09/09/2022  1:15 PM [facility] NURSE [facility]  09/09/2022  1:30 PM [facility] US[facility]    Catalina Antigua, MD

## 2022-08-26 NOTE — BH Specialist Note (Signed)
Integrated Behavioral Health Follow Up In-Person Visit  MRN: [id number] Name: Julie Winters  Number of Integrated Behavioral Health Clinician visits: 2 Session Start time:  2:00pm Session End time: 2:30pm Total time in minutes: 30 mins in person at [facility]   Types of Service: General Behavioral Integrated Care (BHI)  Interpretor:No. Interpretor Name and Language: none  Subjective: Julie Winters is a 37 y.o. female accompanied by n/a Patient was referred by Dr. Jolayne Panther for stress and feeling overwhelmed  Patient reports the following symptoms/concerns: hx of iufd, feeling overwhelmed and stress Duration of problem: over one year; Severity of problem: mild  Objective: Mood: good and Affect: Appropriate Risk of harm to self or others: No plan to harm self or others  Life Context: Family and Social: Lives with spouse and children School/Work: n/a Self-Care: rest Life Changes: new pregnancy  Patient and/or Family's Strengths/Protective Factors: Concrete supports in place (healthy food, safe environments, etc.)  Goals Addressed: Patient will:  Reduce symptoms of: stress   Increase knowledge and/or ability of: coping skills and stress reduction   Demonstrate ability to: Increase healthy adjustment to current life circumstances  Progress towards Goals: Ongoing  Interventions: Interventions utilized:  Supportive Counseling Standardized Assessments completed: Not Needed  Patient and/or Family Response: Julie Winters reports she is managing well with current pregnancy however she occasionally feeling overwhelmed with parenting task.   Assessment: Patient currently experiencing adjustment disorder unspecifed.   Patient may benefit from integrated behavioral health.  Plan: Follow up with behavioral health clinician on : as needed Behavioral recommendations: communicate need for added support, prioritize rest and self care, delegate task to prevent  burnout Referral(s): Integrated [employer] (In Clinic) "From scale of 1-10, how likely are you to follow plan?":    Gwyndolyn Saxon, LCSW

## 2022-08-30 LAB — AFP, SERUM, OPEN SPINA BIFIDA
AFP MoM: 1.24
AFP Value: 49 ng/mL
Gest. Age on Collection Date: 17.1 weeks
Maternal Age At EDD: 35.6 yr
OSBR Risk 1 IN: 10000
Test Results:: NEGATIVE
Weight: 172 [lb_av]

## 2022-09-04 ENCOUNTER — Encounter: Payer: Self-pay | Admitting: *Deleted

## 2022-09-09 ENCOUNTER — Encounter: Payer: Self-pay | Admitting: *Deleted

## 2022-09-09 ENCOUNTER — Ambulatory Visit: Payer: Medicaid Other | Admitting: *Deleted

## 2022-09-09 ENCOUNTER — Ambulatory Visit: Payer: Medicaid Other | Attending: Obstetrics and Gynecology

## 2022-09-09 VITALS — BP 104/59 | HR 88

## 2022-09-09 DIAGNOSIS — O09292 Supervision of pregnancy with other poor reproductive or obstetric history, second trimester: Secondary | ICD-10-CM

## 2022-09-09 DIAGNOSIS — O0990 Supervision of high risk pregnancy, unspecified, unspecified trimester: Secondary | ICD-10-CM | POA: Diagnosis not present

## 2022-09-09 DIAGNOSIS — O09522 Supervision of elderly multigravida, second trimester: Secondary | ICD-10-CM | POA: Diagnosis not present

## 2022-09-09 DIAGNOSIS — Z8279 Family history of other congenital malformations, deformations and chromosomal abnormalities: Secondary | ICD-10-CM

## 2022-09-09 DIAGNOSIS — Z148 Genetic carrier of other disease: Secondary | ICD-10-CM | POA: Diagnosis not present

## 2022-09-09 DIAGNOSIS — Z3A19 19 weeks gestation of pregnancy: Secondary | ICD-10-CM

## 2022-09-09 DIAGNOSIS — O285 Abnormal chromosomal and genetic finding on antenatal screening of mother: Secondary | ICD-10-CM

## 2022-09-10 ENCOUNTER — Other Ambulatory Visit: Payer: Self-pay | Admitting: *Deleted

## 2022-09-10 DIAGNOSIS — Z362 Encounter for other antenatal screening follow-up: Secondary | ICD-10-CM

## 2022-09-22 ENCOUNTER — Ambulatory Visit (INDEPENDENT_AMBULATORY_CARE_PROVIDER_SITE_OTHER): Payer: Medicaid Other | Admitting: Obstetrics and Gynecology

## 2022-09-22 ENCOUNTER — Encounter: Payer: Self-pay | Admitting: Obstetrics and Gynecology

## 2022-09-22 VITALS — BP 108/65 | HR 93 | Wt 181.8 lb

## 2022-09-22 DIAGNOSIS — O09522 Supervision of elderly multigravida, second trimester: Secondary | ICD-10-CM

## 2022-09-22 DIAGNOSIS — O0990 Supervision of high risk pregnancy, unspecified, unspecified trimester: Secondary | ICD-10-CM

## 2022-09-22 DIAGNOSIS — O09299 Supervision of pregnancy with other poor reproductive or obstetric history, unspecified trimester: Secondary | ICD-10-CM

## 2022-09-22 DIAGNOSIS — Z8759 Personal history of other complications of pregnancy, childbirth and the puerperium: Secondary | ICD-10-CM

## 2022-09-22 NOTE — Progress Notes (Signed)
Pt presents for ROB visit. Pt c/o sore throat for 1 week. No concerns.

## 2022-09-22 NOTE — Progress Notes (Signed)
   PRENATAL VISIT NOTE  Subjective:  Julie Winters is a 37 y.o. [id number] at [redacted]w[redacted]d being seen today for ongoing prenatal care.  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  Patient reports no complaints.  Contractions: Not present. Vag. Bleeding: None.  Movement: Present. Denies leaking of fluid.   The following portions of the patient's history were reviewed and updated as appropriate: allergies, current medications, past family history, past medical history, past social history, past surgical history and problem list.   Objective:   Vitals:   09/22/22 1357  BP: 108/65  Pulse: 93  Weight: 181 lb 12.8 oz (82.5 kg)    Fetal Status: Fetal Heart Rate (bpm): 146   Movement: Present     General:  Alert, oriented and cooperative. Patient is in no acute distress.  Skin: Skin is warm and dry. No rash noted.   Cardiovascular: Normal heart rate noted  Respiratory: Normal respiratory effort, no problems with respiration noted  Abdomen: Soft, gravid, appropriate for gestational age.  Pain/Pressure: Present     Pelvic: Cervical exam deferred        Extremities: Normal range of motion.  Edema: None  Mental Status: Normal mood and affect. Normal behavior. Normal judgment and thought content.   Assessment and Plan:  Pregnancy: A5W0981 at [redacted]w[redacted]d 1. Supervision of high risk pregnancy, antepartum BP and FHR normal Feeling vigorous movement  Doing well overall Anticipatory guidance regarding future appts  2. Multigravida of advanced maternal age in second trimester Continue ASA F/u ultrasound 7/3  3. History of IUFD 18 week IUFD  4. History of delivery by vacuum extraction, currently pregnant Second baby VAD d/t FHR    Preterm labor symptoms and general obstetric precautions including but  not limited to vaginal bleeding, contractions, leaking of fluid and fetal movement were reviewed in detail with the patient. Please refer to After Visit Summary for other counseling recommendations.   Return in about 4 weeks (around 10/20/2022) for OB VISIT (MD or APP).  Future Appointments  Date Time Provider Department Center  10/15/2022  1:30 PM [facility]  10/20/2022  2:30 PM [place], Wilmer Floor, CNM [facility] None    Albertine Grates, FNP

## 2022-10-15 ENCOUNTER — Ambulatory Visit: Payer: Medicaid Other | Attending: Obstetrics and Gynecology

## 2022-10-15 ENCOUNTER — Other Ambulatory Visit: Payer: Self-pay | Admitting: *Deleted

## 2022-10-15 DIAGNOSIS — O09292 Supervision of pregnancy with other poor reproductive or obstetric history, second trimester: Secondary | ICD-10-CM | POA: Diagnosis not present

## 2022-10-15 DIAGNOSIS — Z362 Encounter for other antenatal screening follow-up: Secondary | ICD-10-CM

## 2022-10-15 DIAGNOSIS — O285 Abnormal chromosomal and genetic finding on antenatal screening of mother: Secondary | ICD-10-CM

## 2022-10-15 DIAGNOSIS — Z148 Genetic carrier of other disease: Secondary | ICD-10-CM

## 2022-10-15 DIAGNOSIS — O09522 Supervision of elderly multigravida, second trimester: Secondary | ICD-10-CM | POA: Diagnosis not present

## 2022-10-15 DIAGNOSIS — O365990 Maternal care for other known or suspected poor fetal growth, unspecified trimester, not applicable or unspecified: Secondary | ICD-10-CM

## 2022-10-15 DIAGNOSIS — Z3A24 24 weeks gestation of pregnancy: Secondary | ICD-10-CM

## 2022-10-15 DIAGNOSIS — Z8759 Personal history of other complications of pregnancy, childbirth and the puerperium: Secondary | ICD-10-CM

## 2022-10-20 ENCOUNTER — Encounter: Payer: Medicaid Other | Admitting: Advanced Practice Midwife

## 2022-10-27 ENCOUNTER — Encounter: Payer: Medicaid Other | Admitting: Student

## 2022-10-27 NOTE — Progress Notes (Deleted)
   PRENATAL VISIT NOTE  Subjective:  Julie Winters is a 37 y.o. [id number] at [redacted]w[redacted]d being seen today for ongoing prenatal care.  She is currently monitored for the following issues for this {Blank single:19197::"high-risk","low-risk"} pregnancy and has Genetic carrier status; Supervision of high risk pregnancy, antepartum; History of IUFD; and Advanced maternal age in multigravida on their problem list.  Patient reports {sx:[id number]}.   .  .   . Denies leaking of fluid.   The following portions of the patient's history were reviewed and updated as appropriate: allergies, current medications, past family history, past medical history, past social history, past surgical history and problem list.   Objective:  There were no vitals filed for this visit.  Fetal Status:           General:  Alert, oriented and cooperative. Patient is in no acute distress.  Skin: Skin is warm and dry. No rash noted.   Cardiovascular: Normal heart rate noted  Respiratory: Normal respiratory effort, no problems with respiration noted  Abdomen: Soft, gravid, appropriate for gestational age.        Pelvic: {Blank single:19197::"Cervical exam performed in the presence of a chaperone","Cervical exam deferred"}        Extremities: Normal range of motion.     Mental Status: Normal mood and affect. Normal behavior. Normal judgment and thought content.   Assessment and Plan:  Pregnancy: [id number] at [redacted]w[redacted]d 1. Supervision of high risk pregnancy, antepartum ***  2. [redacted] weeks gestation of pregnancy - gtt at next visit  3. Multigravida of advanced maternal age in second trimester ASA therapy Follow up growth scan is scheduled   4. History of IUFD - at 18 weeks  {Blank single:19197::"Term","Preterm"} labor symptoms and general obstetric precautions including but not limited to vaginal bleeding, contractions, leaking of fluid and fetal movement were reviewed in detail with the patient. Please refer to After Visit Summary  for other counseling recommendations.   No follow-ups on file.  Future Appointments  Date Time Provider Department Center  10/27/2022  2:10 PM Corlis Hove, NP [facility] None  12/22/2022  2:15 PM [facility] NURSE [facility]  12/22/2022  2:30 PM [facility]1 [facility]    Corlis Hove, NP

## 2022-11-07 ENCOUNTER — Encounter (HOSPITAL_COMMUNITY): Payer: Self-pay | Admitting: Obstetrics and Gynecology

## 2022-11-07 ENCOUNTER — Inpatient Hospital Stay (HOSPITAL_COMMUNITY)
Admission: AD | Admit: 2022-11-07 | Discharge: 2022-11-07 | Disposition: A | Discharge status: Home / Self Care | Payer: Medicaid Other | Attending: Obstetrics and Gynecology | Admitting: Obstetrics and Gynecology

## 2022-11-07 ENCOUNTER — Telehealth: Payer: Self-pay

## 2022-11-07 DIAGNOSIS — K625 Hemorrhage of anus and rectum: Secondary | ICD-10-CM | POA: Diagnosis not present

## 2022-11-07 DIAGNOSIS — O2242 Hemorrhoids in pregnancy, second trimester: Secondary | ICD-10-CM | POA: Diagnosis not present

## 2022-11-07 DIAGNOSIS — O368130 Decreased fetal movements, third trimester, not applicable or unspecified: Secondary | ICD-10-CM | POA: Diagnosis present

## 2022-11-07 DIAGNOSIS — O2243 Hemorrhoids in pregnancy, third trimester: Secondary | ICD-10-CM | POA: Insufficient documentation

## 2022-11-07 DIAGNOSIS — O4692 Antepartum hemorrhage, unspecified, second trimester: Secondary | ICD-10-CM | POA: Diagnosis present

## 2022-11-07 DIAGNOSIS — Z3A27 27 weeks gestation of pregnancy: Secondary | ICD-10-CM | POA: Diagnosis not present

## 2022-11-07 LAB — URINALYSIS, ROUTINE W REFLEX MICROSCOPIC
Bacteria, UA: NONE SEEN
Bilirubin Urine: NEGATIVE
Glucose, UA: NEGATIVE mg/dL
Hgb urine dipstick: NEGATIVE
Ketones, ur: NEGATIVE mg/dL
Leukocytes,Ua: NEGATIVE
Nitrite: NEGATIVE
Protein, ur: NEGATIVE mg/dL
Specific Gravity, Urine: 1.021 (ref 1.005–1.030)
pH: 5 (ref 5.0–8.0)

## 2022-11-07 LAB — WET PREP, GENITAL
Clue Cells Wet Prep HPF POC: NONE SEEN
Sperm: NONE SEEN
Trich, Wet Prep: NONE SEEN
WBC, Wet Prep HPF POC: <10 (ref <10)
Yeast Wet Prep HPF POC: NONE SEEN

## 2022-11-07 MED ORDER — WITCH HAZEL-GLYCERIN EX PADS
1.0000 | MEDICATED_PAD | CUTANEOUS | 12 refills | Status: DC | PRN
Start: 2022-11-07 — End: 2023-01-23

## 2022-11-07 MED ORDER — HYDROCORTISONE ACETATE 25 MG RE SUPP: 25.0000 mg | Freq: Two times a day (BID) | RECTAL | 1 refills | Status: DC

## 2022-11-07 NOTE — MAU Provider Note (Signed)
History     CSN: [id number]  Arrival date and time: 11/07/22 1051   Event Date/Time   First Provider Initiated Contact with Patient 11/07/22 1147      Chief Complaint  Patient presents with   Rectal Bleeding   HPI Julie Winters is a 37 y.o. [id number] at [redacted]w[redacted]d who presents to MAU for bleeding. She reports after having a bowel movement she wiped and saw blood. She thinks bleeding was from her rectum as she strained a bit. She reports she does have hemorrhoids. She denies constipation. She denies contractions, leaking fluid or recent intercourse. She reports some external vaginal itching but thinks it is because her pubic hair is long. She reports normal fetal movement.   Patient receives [facility]. Her next appointment is on 7/30.   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 No pertinent family history.  Social History   Tobacco Use   Smoking status: Never    Passive exposure: Never   Smokeless tobacco: Never  Vaping Use   Vaping status: Never Used  Substance Use Topics   Alcohol use: No    Alcohol/week: 0.0 standard drinks of alcohol   Drug use: No    Allergies: No Known Allergies  No medications prior to admission.    Review of Systems  Gastrointestinal:  Positive for anal bleeding.       Hemorrhoids    Physical Exam   Blood pressure 112/63, pulse 85, temperature 98.3 F (36.8 C), resp. rate 18, height 5\' 7"  (1.702 m), weight 83.9 kg, last menstrual period 04/27/2022, unknown if currently breastfeeding.  Physical Exam Vitals and nursing note reviewed. Exam conducted with a chaperone present.  Constitutional:      General: She is not in acute distress. Eyes:     Extraocular Movements: Extraocular movements intact.     Pupils: Pupils are equal, round, and reactive to light.  Cardiovascular:     Rate and Rhythm: Normal rate.  Pulmonary:     Effort: Pulmonary effort is normal. No respiratory distress.  Abdominal:     Palpations: Abdomen is soft.     Tenderness: There is no abdominal tenderness.     Comments: Gravid   Genitourinary:    Comments: Normal external female genitalia, vaginal walls pink and well-rugated, small amount thick white discharge, no bleeding, cervix visually closed without lesions/masses Rectum: large external hemorrhoid  Musculoskeletal:        General: Normal range of motion.  Cervical back: Normal range of motion.  Skin:    General: Skin is warm and dry.  Neurological:     General: No focal deficit present.     Mental Status: She is alert and oriented to person, place, and time.  Psychiatric:        Mood and Affect: Mood normal.        Behavior: Behavior normal.    NST FHR: 145 bpm, moderate variability, +10x10 accels, no decels Toco: ui  Results for orders placed or performed during the hospital encounter of 11/07/22 (from the past 24 hour(s))  Urinalysis, Routine w reflex microscopic -Urine, Clean Catch     Status: Abnormal   Collection Time: 11/07/22 11:55 AM  Result Value Ref Range   Color, Urine YELLOW YELLOW   APPearance HAZY (A) CLEAR   Specific Gravity, Urine 1.021 1.005 - 1.030   pH 5.0 5.0 - 8.0   Glucose, UA NEGATIVE NEGATIVE mg/dL   Hgb  urine dipstick NEGATIVE NEGATIVE   Bilirubin Urine NEGATIVE NEGATIVE   Ketones, ur NEGATIVE NEGATIVE mg/dL   Protein, ur NEGATIVE NEGATIVE mg/dL   Nitrite NEGATIVE NEGATIVE   Leukocytes,Ua NEGATIVE NEGATIVE   RBC / HPF 0-5 0 - 5 RBC/hpf   WBC, UA 0-5 0 - 5 WBC/hpf   Bacteria, UA NONE SEEN NONE SEEN   Squamous Epithelial / HPF 11-20 0 - 5 /HPF   Mucus PRESENT   Wet prep, genital     Status: None   Collection Time: 11/07/22 11:59 AM   Specimen: Cervix  Result Value Ref Range   Yeast Wet Prep HPF POC NONE SEEN NONE SEEN   Trich, Wet Prep NONE SEEN NONE SEEN   Clue Cells Wet Prep HPF POC NONE SEEN NONE SEEN   WBC, Wet Prep HPF POC <10 <10   Sperm NONE SEEN     MAU Course  Procedures  MDM UA Wet prep NST  UA and wet prep negative. No vaginal bleeding on spec exam. Patient noted to have a large hemorrhoid.   Assessment and Plan   1. [redacted] weeks gestation of pregnancy   2. Hemorrhoids during pregnancy in third trimester   3. Rectal bleed    - Discharge home in stable condition - Rx for anusol and tuck's pads - Recommend increased fiber and water intake, avoid straining and stool softeners - Return precautions given. Return to MAU as needed - Keep OB appointment as scheduled   Brand Males, CNM 11/07/2022, 4:33 PM

## 2022-11-07 NOTE — Telephone Encounter (Signed)
Pt called in and stated that she noticed a blood clot come from her vagina after having a bowel movement this morning. She states that she is seeing blood when she wipes but is unsure if it is coming from her vagina or related to hemorrhoids. Pt states that she is not feeling fetal movement, advised to be evaluated at the hospital, pt agreed

## 2022-11-07 NOTE — MAU Note (Signed)
.  Julie Winters is a 37 y.o. at [redacted]w[redacted]d here in MAU reporting: when she went to [facility] this morning had bleeding with a small clot when she wiped. Denies any pain or cramping but reports she has not felt baby move this morning. Denies any recent intercourse.   Onset of complaint: this morning Pain score: 0 Vitals:   11/07/22 1105  BP: (!) 112/58  Pulse: 90  Resp: 18  Temp: 98.3 F (36.8 C)     FHT:155 Lab orders placed from triage:  u/a

## 2022-11-11 ENCOUNTER — Ambulatory Visit (INDEPENDENT_AMBULATORY_CARE_PROVIDER_SITE_OTHER): Payer: Medicaid Other | Admitting: Obstetrics and Gynecology

## 2022-11-11 VITALS — BP 109/65 | HR 97 | Wt 187.1 lb

## 2022-11-11 DIAGNOSIS — O0990 Supervision of high risk pregnancy, unspecified, unspecified trimester: Secondary | ICD-10-CM

## 2022-11-11 DIAGNOSIS — Z3A28 28 weeks gestation of pregnancy: Secondary | ICD-10-CM

## 2022-11-11 DIAGNOSIS — O09523 Supervision of elderly multigravida, third trimester: Secondary | ICD-10-CM

## 2022-11-11 DIAGNOSIS — Z8759 Personal history of other complications of pregnancy, childbirth and the puerperium: Secondary | ICD-10-CM

## 2022-11-11 DIAGNOSIS — Z148 Genetic carrier of other disease: Secondary | ICD-10-CM

## 2022-11-11 NOTE — Progress Notes (Addendum)
   PRENATAL VISIT NOTE  Subjective:  Julie Winters is a 37 y.o. [phone] at [redacted]w[redacted]d being seen today for ongoing prenatal care.  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  Patient doing well with no acute concerns today. She reports no complaints and occasional spitting .  Contractions: Not present. Vag. Bleeding: None.  Movement: Present. Denies leaking of fluid.   The following portions of the patient's history were reviewed and updated as appropriate: allergies, current medications, past family history, past medical history, past social history, past surgical history and problem list. Problem list updated.  Objective:   Vitals:   11/11/22 1435  BP: 109/65  Pulse: 97  Weight: 187 lb 1.6 oz (84.9 kg)    Fetal Status: Fetal Heart Rate (bpm): 154 Fundal Height: 29 cm Movement: Present     General:  Alert, oriented and cooperative. Patient is in no acute distress.  Skin: Skin is warm and dry. No rash noted.   Cardiovascular: Normal heart rate noted  Respiratory: Normal respiratory effort, no problems with respiration noted  Abdomen: Soft, gravid, appropriate for gestational age.  Pain/Pressure: Absent     Pelvic: Cervical exam deferred        Extremities: Normal range of motion.  Edema: None  Mental Status:  Normal mood and affect. Normal behavior. Normal judgment and thought content.   Assessment and Plan:  Pregnancy: [id number] at [redacted]w[redacted]d  1. [redacted] weeks gestation of pregnancy   2. Supervision of high risk pregnancy, antepartum Continue routine prenatal care Will reschedule patient for 2 hour GTT as she has missed this lab  - CBC - RPR - HIV Antibody (routine testing w rflx) - Glucose Tolerance, 2 Hours w/1 Hour; Future  3. History of IUFD No reported testing per MFM  4.  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 Multigravida of advanced maternal age in third trimester Repeat u/s 12/22/22  Preterm labor symptoms and general obstetric precautions including but not limited to vaginal bleeding, contractions, leaking of fluid and fetal movement were reviewed in detail with the patient.  Please refer to After Visit Summary for other counseling recommendations.   Return in about 2 weeks (around 11/25/2022) for ROB, in person.   Mariel Aloe, MD Faculty Attending Center for [facility]

## 2022-11-14 ENCOUNTER — Other Ambulatory Visit: Payer: Medicaid Other

## 2022-11-14 DIAGNOSIS — O0990 Supervision of high risk pregnancy, unspecified, unspecified trimester: Secondary | ICD-10-CM

## 2022-11-17 ENCOUNTER — Encounter: Payer: Medicaid Other | Admitting: Obstetrics and Gynecology

## 2022-11-24 ENCOUNTER — Ambulatory Visit: Payer: Medicaid Other | Admitting: Obstetrics & Gynecology

## 2022-11-24 ENCOUNTER — Encounter: Payer: Self-pay | Admitting: Obstetrics & Gynecology

## 2022-11-24 VITALS — BP 103/61 | HR 93 | Wt 190.0 lb

## 2022-11-24 DIAGNOSIS — O09293 Supervision of pregnancy with other poor reproductive or obstetric history, third trimester: Secondary | ICD-10-CM

## 2022-11-24 DIAGNOSIS — O0993 Supervision of high risk pregnancy, unspecified, third trimester: Secondary | ICD-10-CM | POA: Diagnosis not present

## 2022-11-24 DIAGNOSIS — Z3A30 30 weeks gestation of pregnancy: Secondary | ICD-10-CM

## 2022-11-24 DIAGNOSIS — O09292 Supervision of pregnancy with other poor reproductive or obstetric history, second trimester: Secondary | ICD-10-CM

## 2022-11-24 DIAGNOSIS — O0990 Supervision of high risk pregnancy, unspecified, unspecified trimester: Secondary | ICD-10-CM

## 2022-11-24 NOTE — Progress Notes (Signed)
Pt  states she is still having issues with heartburn.

## 2022-11-24 NOTE — Progress Notes (Signed)
   PRENATAL VISIT NOTE  Subjective:  Julie Winters is a 37 y.o. [id number] at [redacted]w[redacted]d being seen today for ongoing prenatal care.  She is currently monitored for the following issues for this high-risk pregnancy and has Genetic carrier status; Supervision of high risk pregnancy, antepartum; Current pregnancy with history of spontaneous abortion during second trimester of prior pregnancy; and Advanced maternal age in multigravida on their problem list.  Patient reports heartburn.  Contractions: Not present. Vag. Bleeding: None.  Movement: Present. Denies leaking of fluid.   The following portions of the patient's history were reviewed and updated as appropriate: allergies, current medications, past family history, past medical history, past social history, past surgical history and problem list.   Objective:   Vitals:   11/24/22 1422  BP: 103/61  Pulse: 93  Weight: 190 lb (86.2 kg)    Fetal Status: Fetal Heart Rate (bpm): 150   Movement: Present     General:  Alert, oriented and cooperative. Patient is in no acute distress.  Skin: Skin is warm and dry. No rash noted.   Cardiovascular: Normal heart rate noted  Respiratory: Normal respiratory effort, no problems with respiration noted  Abdomen: Soft, gravid, appropriate for gestational age.  Pain/Pressure: Present     Pelvic: Cervical exam deferred        Extremities: Normal range of motion.     Mental Status: Normal mood and affect. Normal behavior. Normal judgment and thought content.   Assessment and Plan:  Pregnancy: [id number] at [redacted]w[redacted]d 1. Current pregnancy with history of spontaneous abortion during second trimester of prior pregnancy Take protonix daily for HB  2. Supervision of high risk pregnancy, antepartum F/u US 3 weeks  Preterm labor symptoms and general obstetric precautions including but not limited to vaginal bleeding, contractions, leaking of fluid and fetal movement were reviewed in detail with the patient. Please  refer to After Visit Summary for other counseling recommendations.   Return in about 2 weeks (around 12/08/2022).  Future Appointments  Date Time Provider Department Center  12/22/2022  2:15 PM [facility] NURSE [facility]  12/22/2022  2:30 PM [facility] US1 [facility]    Scheryl Darter, MD

## 2022-12-16 ENCOUNTER — Encounter: Payer: Self-pay | Admitting: *Deleted

## 2022-12-16 DIAGNOSIS — Z8759 Personal history of other complications of pregnancy, childbirth and the puerperium: Secondary | ICD-10-CM | POA: Insufficient documentation

## 2022-12-22 ENCOUNTER — Other Ambulatory Visit: Payer: Self-pay | Admitting: *Deleted

## 2022-12-22 ENCOUNTER — Ambulatory Visit: Payer: Medicaid Other | Admitting: *Deleted

## 2022-12-22 ENCOUNTER — Ambulatory Visit (INDEPENDENT_AMBULATORY_CARE_PROVIDER_SITE_OTHER): Payer: Medicaid Other | Admitting: Obstetrics and Gynecology

## 2022-12-22 ENCOUNTER — Ambulatory Visit: Payer: Medicaid Other | Attending: Obstetrics

## 2022-12-22 ENCOUNTER — Encounter: Payer: Self-pay | Admitting: *Deleted

## 2022-12-22 VITALS — BP 107/58 | HR 100

## 2022-12-22 VITALS — BP 107/67 | HR 102 | Wt 194.5 lb

## 2022-12-22 DIAGNOSIS — Z148 Genetic carrier of other disease: Secondary | ICD-10-CM | POA: Diagnosis not present

## 2022-12-22 DIAGNOSIS — O09523 Supervision of elderly multigravida, third trimester: Secondary | ICD-10-CM

## 2022-12-22 DIAGNOSIS — O09522 Supervision of elderly multigravida, second trimester: Secondary | ICD-10-CM | POA: Diagnosis present

## 2022-12-22 DIAGNOSIS — O0990 Supervision of high risk pregnancy, unspecified, unspecified trimester: Secondary | ICD-10-CM

## 2022-12-22 DIAGNOSIS — Z8759 Personal history of other complications of pregnancy, childbirth and the puerperium: Secondary | ICD-10-CM | POA: Diagnosis present

## 2022-12-22 DIAGNOSIS — O09293 Supervision of pregnancy with other poor reproductive or obstetric history, third trimester: Secondary | ICD-10-CM

## 2022-12-22 DIAGNOSIS — Z362 Encounter for other antenatal screening follow-up: Secondary | ICD-10-CM | POA: Diagnosis present

## 2022-12-22 DIAGNOSIS — O365990 Maternal care for other known or suspected poor fetal growth, unspecified trimester, not applicable or unspecified: Secondary | ICD-10-CM | POA: Diagnosis present

## 2022-12-22 DIAGNOSIS — O285 Abnormal chromosomal and genetic finding on antenatal screening of mother: Secondary | ICD-10-CM | POA: Diagnosis not present

## 2022-12-22 DIAGNOSIS — Z3A35 35 weeks gestation of pregnancy: Secondary | ICD-10-CM

## 2022-12-22 DIAGNOSIS — Z3A34 34 weeks gestation of pregnancy: Secondary | ICD-10-CM

## 2022-12-22 NOTE — Progress Notes (Signed)
   PRENATAL VISIT NOTE  Subjective:  Julie Winters is a 37 y.o. [id number] at [redacted]w[redacted]d being seen today for ongoing prenatal care.  She is currently monitored for the following issues for this high-risk pregnancy and has Genetic carrier status; Supervision of high risk pregnancy, antepartum; Current pregnancy with history of spontaneous abortion during second trimester of prior pregnancy; Advanced maternal age in multigravida; and History of IUFD on their problem list.  Patient reports no complaints.  Contractions: Irritability. Vag. Bleeding: None.  Movement: Present. Denies leaking of fluid.   The following portions of the patient's history were reviewed and updated as appropriate: allergies, current medications, past family history, past medical history, past social history, past surgical history and problem list.   Objective:   Vitals:   12/22/22 1313  BP: 107/67  Pulse: (!) 102  Weight: 194 lb 8 oz (88.2 kg)    Fetal Status: Fetal Heart Rate (bpm): 145 Fundal Height: 35 cm Movement: Present     General:  Alert, oriented and cooperative. Patient is in no acute distress.  Skin: Skin is warm and dry. No rash noted.   Cardiovascular: Normal heart rate noted  Respiratory: Normal respiratory effort, no problems with respiration noted  Abdomen: Soft, gravid, appropriate for gestational age.  Pain/Pressure: Absent     Pelvic: Cervical exam deferred        Extremities: Normal range of motion.  Edema: None  Mental Status: Normal mood and affect. Normal behavior. Normal judgment and thought content.   Assessment and Plan:  Pregnancy: [id number] at [redacted]w[redacted]d 1. Supervision of high risk pregnancy, antepartum BP and FHR normal  Feeling regular fetal movement FH appropriate  2. History of IUFD Per mfm note previous iufd 18wks, T21 by anora  3. Multigravida of advanced maternal age in third trimester Continue ASA 7/3 u/s efw 23%, afi nml Follow up growth later today  4. [redacted] weeks gestation  of pregnancy Anticipatory guidance regarding swabs next visit  Preterm labor symptoms and general obstetric precautions including but not limited to vaginal bleeding, contractions, leaking of fluid and fetal movement were reviewed in detail with the patient. Please refer to After Visit Summary for other counseling recommendations.   Return in two weeks for routine prenatal   Future Appointments  Date Time Provider Department Center  12/22/2022  2:15 PM [facility] NURSE [facility]  12/22/2022  2:30 PM [facility] US1 [facility]    Albertine Grates, FNP

## 2023-01-05 ENCOUNTER — Ambulatory Visit (INDEPENDENT_AMBULATORY_CARE_PROVIDER_SITE_OTHER): Payer: Medicaid Other | Admitting: Student

## 2023-01-05 ENCOUNTER — Other Ambulatory Visit (HOSPITAL_COMMUNITY)
Admission: RE | Admit: 2023-01-05 | Discharge: 2023-01-05 | Disposition: A | Discharge status: Home / Self Care | Payer: Medicaid Other | Source: Ambulatory Visit | Attending: Student | Admitting: Student

## 2023-01-05 VITALS — BP 112/67 | HR 90 | Wt 195.4 lb

## 2023-01-05 DIAGNOSIS — Z3A36 36 weeks gestation of pregnancy: Secondary | ICD-10-CM | POA: Insufficient documentation

## 2023-01-05 DIAGNOSIS — O0990 Supervision of high risk pregnancy, unspecified, unspecified trimester: Secondary | ICD-10-CM

## 2023-01-05 DIAGNOSIS — O0993 Supervision of high risk pregnancy, unspecified, third trimester: Secondary | ICD-10-CM | POA: Insufficient documentation

## 2023-01-05 DIAGNOSIS — O09523 Supervision of elderly multigravida, third trimester: Secondary | ICD-10-CM | POA: Diagnosis not present

## 2023-01-05 NOTE — Progress Notes (Signed)
   PRENATAL VISIT NOTE  Subjective:  Julie Winters is a 37 y.o. [id number] at [redacted]w[redacted]d being seen today for ongoing prenatal care.  She is currently monitored for the following issues for this low-risk pregnancy and has Genetic carrier status; Supervision of high risk pregnancy, antepartum; Current pregnancy with history of spontaneous abortion during second trimester of prior pregnancy; Advanced maternal age in multigravida; and History of IUFD on their problem list.  Patient reports no complaints.  Contractions: Not present. Vag. Bleeding: None.  Movement: Present. Denies leaking of fluid.   The following portions of the patient's history were reviewed and updated as appropriate: allergies, current medications, past family history, past medical history, past social history, past surgical history and problem list.   Objective:   Vitals:   01/05/23 1443  BP: 112/67  Pulse: 90  Weight: 195 lb 6.4 oz (88.6 kg)    Fetal Status: Fetal Heart Rate (bpm): 141   Movement: Present  Presentation: Vertex  General:  Alert, oriented and cooperative. Patient is in no acute distress.  Skin: Skin is warm and dry. No rash noted.   Cardiovascular: Normal heart rate noted  Respiratory: Normal respiratory effort, no problems with respiration noted  Abdomen: Soft, gravid, appropriate for gestational age.  Pain/Pressure: Present     Pelvic: Cervical exam performed in the presence of a chaperone Dilation: Fingertip Effacement (%): 30, 40 Station: Ballotable  Extremities: Normal range of motion.  Edema: None  Mental Status: Normal mood and affect. Normal behavior. Normal judgment and thought content.   Assessment and Plan:  Pregnancy: [id number] at [redacted]w[redacted]d 1. Supervision of high risk pregnancy, antepartum - frequent fetal movement  2. [redacted] weeks gestation of pregnancy - Strep Gp B NAA - Cervicovaginal ancillary only( [facility])  3. Multigravida of advanced maternal age in third trimester - basa therapy -  repeat growth scan in 2 weeks  Preterm labor symptoms and general obstetric precautions including but not limited to vaginal bleeding, contractions, leaking of fluid and fetal movement were reviewed in detail with the patient. Please refer to After Visit Summary for other counseling recommendations.   Return in about 1 week (around 01/12/2023) for LOB, IN-PERSON.  Future Appointments  Date Time Provider Department Center  01/19/2023  1:30 PM [facility] US5 [facility]    Corlis Hove, NP

## 2023-01-05 NOTE — Progress Notes (Signed)
Pt reports fetal movement with occasional pressure.

## 2023-01-06 LAB — CERVICOVAGINAL ANCILLARY ONLY
Bacterial Vaginitis (gardnerella): NEGATIVE
Candida Glabrata: NEGATIVE
Candida Vaginitis: NEGATIVE
Chlamydia: NEGATIVE
Comment: NEGATIVE
Comment: NEGATIVE
Comment: NEGATIVE
Comment: NEGATIVE
Comment: NEGATIVE
Comment: NORMAL
Neisseria Gonorrhea: NEGATIVE
Trichomonas: NEGATIVE

## 2023-01-07 LAB — STREP GP B NAA: Strep Gp B NAA: NEGATIVE

## 2023-01-12 ENCOUNTER — Ambulatory Visit (INDEPENDENT_AMBULATORY_CARE_PROVIDER_SITE_OTHER): Payer: Medicaid Other | Admitting: Advanced Practice Midwife

## 2023-01-12 VITALS — BP 117/70 | HR 90 | Wt 198.4 lb

## 2023-01-12 DIAGNOSIS — L299 Pruritus, unspecified: Secondary | ICD-10-CM

## 2023-01-12 DIAGNOSIS — O0990 Supervision of high risk pregnancy, unspecified, unspecified trimester: Secondary | ICD-10-CM

## 2023-01-12 DIAGNOSIS — Z3A37 37 weeks gestation of pregnancy: Secondary | ICD-10-CM

## 2023-01-12 DIAGNOSIS — O09523 Supervision of elderly multigravida, third trimester: Secondary | ICD-10-CM

## 2023-01-12 DIAGNOSIS — Z8759 Personal history of other complications of pregnancy, childbirth and the puerperium: Secondary | ICD-10-CM

## 2023-01-12 NOTE — Progress Notes (Signed)
Pt presents for ROB visit. Pt c/o itching all over.

## 2023-01-12 NOTE — Progress Notes (Signed)
   PRENATAL VISIT NOTE  Subjective:  Julie Winters is a 37 y.o. [id number] at [redacted]w[redacted]d being seen today for ongoing prenatal care.  She is currently monitored for the following issues for this low-risk pregnancy and has Genetic carrier status; Supervision of high risk pregnancy, antepartum; Current pregnancy with history of spontaneous abortion during second trimester of prior pregnancy; Advanced maternal age in multigravida; and History of IUFD on their problem list.  Patient reports  itching all over  her body yesterday that has resolved today .  Contractions: Not present. Vag. Bleeding: None.  Movement: Present. Denies leaking of fluid.   The following portions of the patient's history were reviewed and updated as appropriate: allergies, current medications, past family history, past medical history, past social history, past surgical history and problem list.   Objective:   Vitals:   01/12/23 1438  BP: 117/70  Pulse: 90  Weight: 198 lb 6.4 oz (90 kg)    Fetal Status: Fetal Heart Rate (bpm): 144 Fundal Height: 37 cm Movement: Present     General:  Alert, oriented and cooperative. Patient is in no acute distress.  Skin: Skin is warm and dry. No rash noted.   Cardiovascular: Normal heart rate noted  Respiratory: Normal respiratory effort, no problems with respiration noted  Abdomen: Soft, gravid, appropriate for gestational age.  Pain/Pressure: Absent     Pelvic: Cervical exam deferred        Extremities: Normal range of motion.  Edema: None  Mental Status: Normal mood and affect. Normal behavior. Normal judgment and thought content.   Assessment and Plan:  Pregnancy: [id number] at [redacted]w[redacted]d 1. Generalized pruritus --Not present today but was significant yesterday.  Pt did change soaps so will go back to using [employer] until delivery.   - Bile acids, total - Comp Met (CMET)  2. Supervision of high risk pregnancy, antepartum --Anticipatory guidance about next visits/weeks of pregnancy  given.   3. Multigravida of advanced maternal age in third trimester   4. History of IUFD --At 18 weeks  5. [redacted] weeks gestation of pregnancy   Term labor symptoms and general obstetric precautions including but not limited to vaginal bleeding, contractions, leaking of fluid and fetal movement were reviewed in detail with the patient. Please refer to After Visit Summary for other counseling recommendations.   Return in about 1 week (around 01/19/2023) for As scheduled.  Future Appointments  Date Time Provider Department Center  01/19/2023  1:30 PM [facility] US[facility]  01/20/2023  2:30 PM Adam Phenix, MD [facility] None  01/26/2023  2:50 PM Hermina Staggers, MD [facility] None  02/02/2023  2:30 PM Leftwich-Kirby, Wilmer Floor, CNM [facility] None    Sharen Counter, CNM

## 2023-01-14 LAB — COMPREHENSIVE METABOLIC PANEL
ALT: 12 [IU]/L (ref 0–32)
AST: 19 [IU]/L (ref 0–40)
Albumin: 3.4 g/dL — ABNORMAL LOW (ref 3.9–4.9)
Alkaline Phosphatase: 163 [IU]/L — ABNORMAL HIGH (ref 44–121)
BUN/Creatinine Ratio: 7 — ABNORMAL LOW (ref 9–23)
BUN: 5 mg/dL — ABNORMAL LOW (ref 6–20)
Bilirubin Total: 0.4 mg/dL (ref 0.0–1.2)
CO2: 18 mmol/L — ABNORMAL LOW (ref 20–29)
Calcium: 9.1 mg/dL (ref 8.7–10.2)
Chloride: 106 mmol/L (ref 96–106)
Creatinine, Ser: 0.72 mg/dL (ref 0.57–1.00)
Globulin, Total: 2.8 g/dL (ref 1.5–4.5)
Glucose: 76 mg/dL (ref 70–99)
Potassium: 3.9 mmol/L (ref 3.5–5.2)
Sodium: 139 mmol/L (ref 134–144)
Total Protein: 6.2 g/dL (ref 6.0–8.5)
eGFR: 112 mL/min/{1.73_m2} (ref >59)

## 2023-01-14 LAB — BILE ACIDS, TOTAL: Bile Acids Total: 8.8 umol/L (ref 0.0–10.0)

## 2023-01-19 ENCOUNTER — Ambulatory Visit: Payer: Medicaid Other | Attending: Maternal & Fetal Medicine

## 2023-01-19 ENCOUNTER — Other Ambulatory Visit: Payer: Self-pay | Admitting: Maternal & Fetal Medicine

## 2023-01-19 ENCOUNTER — Ambulatory Visit (HOSPITAL_BASED_OUTPATIENT_CLINIC_OR_DEPARTMENT_OTHER): Payer: Medicaid Other | Admitting: Obstetrics

## 2023-01-19 DIAGNOSIS — Z3A38 38 weeks gestation of pregnancy: Secondary | ICD-10-CM | POA: Insufficient documentation

## 2023-01-19 DIAGNOSIS — O09513 Supervision of elderly primigravida, third trimester: Secondary | ICD-10-CM | POA: Diagnosis not present

## 2023-01-19 DIAGNOSIS — Z362 Encounter for other antenatal screening follow-up: Secondary | ICD-10-CM | POA: Diagnosis present

## 2023-01-19 DIAGNOSIS — Z8759 Personal history of other complications of pregnancy, childbirth and the puerperium: Secondary | ICD-10-CM | POA: Insufficient documentation

## 2023-01-19 DIAGNOSIS — O365930 Maternal care for other known or suspected poor fetal growth, third trimester, not applicable or unspecified: Secondary | ICD-10-CM | POA: Diagnosis present

## 2023-01-19 DIAGNOSIS — O09523 Supervision of elderly multigravida, third trimester: Secondary | ICD-10-CM

## 2023-01-19 DIAGNOSIS — Z148 Genetic carrier of other disease: Secondary | ICD-10-CM | POA: Diagnosis not present

## 2023-01-19 DIAGNOSIS — O09293 Supervision of pregnancy with other poor reproductive or obstetric history, third trimester: Secondary | ICD-10-CM

## 2023-01-19 NOTE — Progress Notes (Signed)
MFM Note  Julie Winters  was seen due to advanced maternal age at 54 weeks and 1 day.  She denies any problems since her last exam and reports feeling vigorous fetal movements throughout the day.    On today's exam, the EFW of 5 pounds 15 ounces measures at the 8th percentile for her gestational age indicating IUGR.     The total AFI was 16.48 cm (within normal limits).  A BPP performed today was 8 out of 8.    Doppler studies of the umbilical arteries showed a normal S/D ratio of 1.97 .  There were no signs of absent or reversed end-diastolic flow.    Due to IUGR at her current gestational age, delivery is recommended this week.  I discussed her case with the second attending.  She will schedule the patient for induction this week.    The patient stated that all of her questions were answered today.    No further exams were scheduled in our office.  A total of 20 minutes was spent counseling and coordinating the care for this patient.  Greater than 50% of the time was spent in direct face-to-face contact.

## 2023-01-20 ENCOUNTER — Encounter: Payer: Medicaid Other | Admitting: Obstetrics & Gynecology

## 2023-01-20 ENCOUNTER — Inpatient Hospital Stay (HOSPITAL_COMMUNITY): Payer: Medicaid Other

## 2023-01-21 ENCOUNTER — Encounter (HOSPITAL_COMMUNITY): Payer: Self-pay | Admitting: Obstetrics and Gynecology

## 2023-01-21 ENCOUNTER — Inpatient Hospital Stay (HOSPITAL_COMMUNITY)
Admission: RE | Admit: 2023-01-21 | Discharge: 2023-01-23 | DRG: 807 | Disposition: A | Discharge status: Home / Self Care | Payer: Medicaid Other | Attending: Obstetrics & Gynecology | Admitting: Obstetrics & Gynecology

## 2023-01-21 ENCOUNTER — Inpatient Hospital Stay (HOSPITAL_COMMUNITY): Payer: Medicaid Other | Admitting: Anesthesiology

## 2023-01-21 ENCOUNTER — Other Ambulatory Visit: Payer: Self-pay

## 2023-01-21 DIAGNOSIS — O9902 Anemia complicating childbirth: Secondary | ICD-10-CM | POA: Diagnosis present

## 2023-01-21 DIAGNOSIS — Z148 Genetic carrier of other disease: Secondary | ICD-10-CM | POA: Diagnosis not present

## 2023-01-21 DIAGNOSIS — O365930 Maternal care for other known or suspected poor fetal growth, third trimester, not applicable or unspecified: Secondary | ICD-10-CM | POA: Diagnosis not present

## 2023-01-21 DIAGNOSIS — O09292 Supervision of pregnancy with other poor reproductive or obstetric history, second trimester: Secondary | ICD-10-CM | POA: Diagnosis not present

## 2023-01-21 DIAGNOSIS — O0990 Supervision of high risk pregnancy, unspecified, unspecified trimester: Secondary | ICD-10-CM

## 2023-01-21 DIAGNOSIS — O09523 Supervision of elderly multigravida, third trimester: Secondary | ICD-10-CM | POA: Diagnosis not present

## 2023-01-21 DIAGNOSIS — Z3A38 38 weeks gestation of pregnancy: Secondary | ICD-10-CM

## 2023-01-21 DIAGNOSIS — O09529 Supervision of elderly multigravida, unspecified trimester: Secondary | ICD-10-CM

## 2023-01-21 DIAGNOSIS — O365990 Maternal care for other known or suspected poor fetal growth, unspecified trimester, not applicable or unspecified: Principal | ICD-10-CM | POA: Diagnosis present

## 2023-01-21 DIAGNOSIS — Z8759 Personal history of other complications of pregnancy, childbirth and the puerperium: Secondary | ICD-10-CM

## 2023-01-21 LAB — CBC
HCT: 29.2 % — ABNORMAL LOW (ref 36.0–46.0)
Hemoglobin: 9 g/dL — ABNORMAL LOW (ref 12.0–15.0)
MCH: 25.9 pg — ABNORMAL LOW (ref 26.0–34.0)
MCHC: 30.8 g/dL (ref 30.0–36.0)
MCV: 83.9 fL (ref 80.0–100.0)
Platelets: 168 10*3/uL (ref 150–400)
RBC: 3.48 MIL/uL — ABNORMAL LOW (ref 3.87–5.11)
RDW: 16 % — ABNORMAL HIGH (ref 11.5–15.5)
WBC: 8.2 10*3/uL (ref 4.0–10.5)
nRBC: 0 % (ref 0.0–0.2)

## 2023-01-21 LAB — TYPE AND SCREEN
ABO/RH(D): A POS
Antibody Screen: NEGATIVE

## 2023-01-21 LAB — RPR: RPR Ser Ql: NONREACTIVE

## 2023-01-21 MED ORDER — LIDOCAINE HCL (PF) 1 % IJ SOLN
INTRAMUSCULAR | Status: DC | PRN
  Administered 2023-01-21 (×2): 4 mL via EPIDURAL

## 2023-01-21 MED ORDER — EPHEDRINE 5 MG/ML INJ: 10.0000 mg | INTRAVENOUS | Status: DC | PRN

## 2023-01-21 MED ORDER — LACTATED RINGERS IV SOLN: 500.0000 mL | Freq: Once | INTRAVENOUS | Status: DC

## 2023-01-21 MED ORDER — FENTANYL-BUPIVACAINE-NACL 0.5-0.125-0.9 MG/250ML-% EP SOLN
12.0000 mL/h | EPIDURAL | Status: DC | PRN
  Administered 2023-01-21: 12 mL/h via EPIDURAL
  Filled 2023-01-21: qty 250

## 2023-01-21 MED ORDER — FENTANYL CITRATE (PF) 100 MCG/2ML IJ SOLN
50.0000 ug | INTRAMUSCULAR | Status: DC | PRN
  Administered 2023-01-21: 100 ug via INTRAVENOUS
  Filled 2023-01-21: qty 2

## 2023-01-21 MED ORDER — TERBUTALINE SULFATE 1 MG/ML IJ SOLN
0.2500 mg | Freq: Once | INTRAMUSCULAR | Status: DC | PRN
  Filled 2023-01-21: qty 1

## 2023-01-21 MED ORDER — OXYTOCIN-SODIUM CHLORIDE 30-0.9 UT/500ML-% IV SOLN
2.5000 [IU]/h | INTRAVENOUS | Status: DC
  Administered 2023-01-21: 2.5 [IU]/h via INTRAVENOUS
  Filled 2023-01-21: qty 500

## 2023-01-21 MED ORDER — SOD CITRATE-CITRIC ACID 500-334 MG/5ML PO SOLN: 30.0000 mL | ORAL | Status: DC | PRN

## 2023-01-21 MED ORDER — OXYCODONE-ACETAMINOPHEN 5-325 MG PO TABS: 1.0000 | ORAL_TABLET | ORAL | Status: DC | PRN

## 2023-01-21 MED ORDER — ACETAMINOPHEN 325 MG PO TABS: 650.0000 mg | ORAL_TABLET | ORAL | Status: DC | PRN

## 2023-01-21 MED ORDER — LACTATED RINGERS IV SOLN: INTRAVENOUS | Status: DC

## 2023-01-21 MED ORDER — PHENYLEPHRINE 80 MCG/ML (10ML) SYRINGE FOR IV PUSH (FOR BLOOD PRESSURE SUPPORT): 80.0000 ug | PREFILLED_SYRINGE | INTRAVENOUS | Status: DC | PRN

## 2023-01-21 MED ORDER — OXYTOCIN BOLUS FROM INFUSION
333.0000 mL | Freq: Once | INTRAVENOUS | Status: AC
  Administered 2023-01-21: 333 mL via INTRAVENOUS

## 2023-01-21 MED ORDER — SODIUM CHLORIDE 0.9% FLUSH: 3.0000 mL | INTRAVENOUS | Status: DC | PRN

## 2023-01-21 MED ORDER — LIDOCAINE HCL (PF) 1 % IJ SOLN: 30.0000 mL | INTRAMUSCULAR | Status: DC | PRN

## 2023-01-21 MED ORDER — LACTATED RINGERS IV SOLN: 500.0000 mL | INTRAVENOUS | Status: DC | PRN

## 2023-01-21 MED ORDER — ONDANSETRON HCL 4 MG/2ML IJ SOLN: 4.0000 mg | Freq: Four times a day (QID) | INTRAMUSCULAR | Status: DC | PRN

## 2023-01-21 MED ORDER — OXYCODONE-ACETAMINOPHEN 5-325 MG PO TABS: 2.0000 | ORAL_TABLET | ORAL | Status: DC | PRN

## 2023-01-21 MED ORDER — TERBUTALINE SULFATE 1 MG/ML IJ SOLN
0.2500 mg | Freq: Once | INTRAMUSCULAR | Status: AC | PRN
  Administered 2023-01-21: 0.25 mg via SUBCUTANEOUS

## 2023-01-21 MED ORDER — LACTATED RINGERS AMNIOINFUSION: INTRAVENOUS | Status: DC

## 2023-01-21 MED ORDER — OXYTOCIN-SODIUM CHLORIDE 30-0.9 UT/500ML-% IV SOLN
1.0000 m[IU]/min | INTRAVENOUS | Status: DC
  Administered 2023-01-21 (×2): 2 m[IU]/min via INTRAVENOUS

## 2023-01-21 MED ORDER — DIPHENHYDRAMINE HCL 50 MG/ML IJ SOLN: 12.5000 mg | INTRAMUSCULAR | Status: DC | PRN

## 2023-01-21 MED ORDER — MISOPROSTOL 50MCG HALF TABLET
50.0000 ug | ORAL_TABLET | ORAL | Status: DC | PRN
  Administered 2023-01-21 (×3): 50 ug via BUCCAL
  Filled 2023-01-21 (×3): qty 1

## 2023-01-21 NOTE — Progress Notes (Signed)
LABOR PROGRESS NOTE  [id number] at [redacted]w[redacted]d admitted for IOL for IUGR  S: Patient with some FHR decelerations but now resolved with positioning. On hands and knees.  Patient is comfortable.   O:  BP 129/66   Pulse 97   Temp 98.1 F (36.7 C) (Axillary)   Resp 19   Ht 5\' 7"  (1.702 m)   Wt 91.2 kg   LMP 04/27/2022 (Exact Date)   SpO2 100%   BMI 31.50 kg/m  EFM: 120/mod/+ accels/early vs variables.  Toco: every 5 min  CVE: Dilation: 6 Effacement (%): 90 Cervical Position: Posterior Station: -2 Presentation: Vertex Exam by:: Dr. Macon Large FSE placed. Good scalp stimulation response during check.  A&P:   #Labor/FWB: Decel improved with positional changers, will continue to watch closely for now. Making slow cervical change.  Did discuss with patient that if this continues to happen, may need to proceed with cesarean delivery.  Category 1 FHR tracing currently.  #Pain: Epidural #GBS negative #Hopeful for vaginal delivery  Jaynie Collins, MD Faculty Practice, Center for [facility] 01/21/23 8:05 PM

## 2023-01-21 NOTE — H&P (Signed)
LABOR AND DELIVERY ADMISSION HISTORY AND PHYSICAL NOTE  Julie Winters is a 37 y.o. female [phone] with IUP at [redacted]w[redacted]d presenting for IOL for IUGR. Pregnancy c/b AMA, hx IUFD at 18 weeks.  Patient reports the fetal movement as active. Patient reports uterine contraction  activity as none. Patient reports  vaginal bleeding as none. Patient describes fluid per vagina as None.   Patient denies headache, vision changes, chest pain, shortness of breath, right upper quadrant pain, or LE edema.  She plans on breast feeding feeding. Her contraception plan is: bilateral tubal ligation.  Prenatal History/Complications: PNC at [facility]  Sono:  @[redacted]w[redacted]d , CWD, normal anatomy, cephalic presentation, anterior placenta, 8%ile, EFW 5 lb 15 oz, AC 18%  Pregnancy complications:  Patient Active Problem List   Diagnosis Date Noted   IUGR (intrauterine growth restriction) affecting care of mother 01/21/2023   History of IUFD 12/16/2022   Current pregnancy with history of spontaneous abortion during second trimester of prior pregnancy 07/28/2022   Advanced maternal age in multigravida 07/28/2022   Supervision of high risk pregnancy, antepartum 07/07/2022   Genetic carrier status 02/01/2019    Past Medical History: 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   01-20-19 Antibody screen positive but weak and will repeat in 4 weeks  Previous antibody screen in 2015 was normal   Retained placenta 12/05/2021    Past Surgical History: Past Surgical History:  Procedure Laterality Date   DILATION AND CURETTAGE OF UTERUS N/A 12/05/2021   Procedure: DILATATION AND CURETTAGE;  Surgeon: Milas Hock, MD;  Location: [facility] LD [facility]S;  Service: Gynecology;  Laterality: N/A;    Obstetrical History: OB History     Gravida  4   Para  2   Term  2   Preterm  0   AB  1   Living  2      SAB  1   IAB   0   Ectopic      Multiple  0   Live Births  2           Social History: Social History   Socioeconomic History   Marital status: Married    Spouse name: Not on file   Number of children: Not on file   Years of education: Not on file   Highest education level: Not on file  Occupational History   Not on file  Tobacco Use   Smoking status: Never    Passive exposure: Never   Smokeless tobacco: Never  Vaping Use   Vaping status: Never Used  Substance and Sexual Activity   Alcohol use: No    Alcohol/week: 0.0 standard drinks of alcohol   Drug use: No   Sexual activity: Not Currently    Partners: Male    Birth control/protection: None  Other Topics Concern   Not on file  Social History Narrative   Not on file   Social Determinants of Health   Financial Resource Strain: Not on file  Food Insecurity: No Food Insecurity (06/17/2021)   [facility]   Hunger Vital Sign    Worried About Running Out of Food in the Last Year: Never true    Ran Out of Food in the Last Year: Never true  Transportation Needs: Not on file  Physical Activity: Not on file  Stress: Not on file  Social Connections: Unknown (08/11/2021)   [facility]   Social Network    Social Network: Not on file    Family History: History reviewed. No pertinent family history.  Allergies: No Known Allergies  Medications Prior to Admission  Medication Sig Dispense Refill Last Dose   aspirin EC 81 MG tablet Take 1 tablet (81 mg total) by mouth daily. Take after 12 weeks for prevention of preeclampsia later in pregnancy 300 tablet 2    hydrocortisone (ANUSOL-HC) 25 MG suppository Place 1 suppository (25 mg total) rectally 2 (two) times daily. (Patient not taking: Reported on 12/22/2022) 12 suppository 1    pantoprazole (PROTONIX) 40 MG tablet Take 1 tablet (40 mg total) by mouth daily. 60 tablet 3    Prenat-Fe Poly-Methfol-FA-DHA (VITAFOL ULTRA)  29-0.6-0.4-200 MG CAPS Take 1 capsule by mouth daily before breakfast. 90 capsule 4    witch hazel-glycerin (TUCKS) pad Apply 1 Application topically as needed for itching. (Patient not taking: Reported on 01/05/2023) 40 each 12      Review of Systems  All systems reviewed and negative except as stated in HPI  Physical Exam BP 118/60   Pulse 98   Temp 98 F (36.7 C) (Axillary)   Ht 5\' 7"  (1.702 m)   Wt 91.2 kg   LMP 04/27/2022 (Exact Date)   BMI 31.50 kg/m   Physical Exam Constitutional:      General: She is not in acute distress.    Appearance: Normal appearance.  Cardiovascular:     Rate and Rhythm: Normal rate and regular rhythm.  Pulmonary:     Effort: Pulmonary effort is normal.  Abdominal:     Comments: Gravid  Musculoskeletal:        General: No swelling.  Skin:    General: Skin is warm and dry.  Neurological:     Mental Status: Mental status is at baseline.  Psychiatric:        Mood and Affect: Mood normal.   Presentation: cephalic by US  Fetal monitoring: Baseline: 150 bpm, Variability: Good {> 6 bpm), Accelerations: Reactive, and Decelerations: Absent Uterine activity: UI  Presentation: Vertex Exam by:: Dr. Earlene Plater Prenatal labs: ABO, Rh: --/--/A POS (10/09 0058) Antibody: NEG (10/09 0058) Rubella: 4.39 (03/25 1615) RPR: Non Reactive (07/30 1532)  HBsAg: Negative (03/25 1615)  HIV: Non Reactive (07/30 1532)  GC/Chlamydia:  Neisseria Gonorrhea  Date Value Ref Range Status  01/05/2023 Negative  Final   Chlamydia  Date Value Ref Range Status  01/05/2023 Negative  Final   GBS: Negative/-- (09/23 1502)    Prenatal Transfer Tool  Maternal Diabetes: No Genetic Screening: NIPS LR, AFP neg, SMA carrier Maternal Ultrasounds/Referrals: IUGR Fetal Ultrasounds or other Referrals:  Referred to Materal Fetal Medicine  Maternal Substance Abuse:  No Significant Maternal Medications:  ASA Significant Maternal Lab Results: Group B Strep negative  Results  for orders placed or performed during the hospital encounter of 01/21/23 (from the past 24 hour(s))  CBC   Collection Time: 01/21/23 12:58 AM  Result Value Ref Range   WBC 8.2 4.0 - 10.5 K/uL   RBC 3.48 (L) 3.87 - 5.11 MIL/uL   Hemoglobin 9.0 (L) 12.0 - 15.0 g/dL   HCT 21.3 (L) 08.6 - 57.8 %   MCV 83.9 80.0 - 100.0 fL   MCH 25.9 (L) 26.0 - 34.0 pg   MCHC 30.8 30.0 - 36.0 g/dL   RDW 46.9 (H) 62.9 - 52.8 %   Platelets 168 150 - 400 K/uL   nRBC 0.0 0.0 -  0.2 %  Type and screen [facility]   Collection Time: 01/21/23 12:58 AM  Result Value Ref Range   ABO/RH(D) A POS    Antibody Screen NEG    Sample Expiration      01/24/2023,2359 Performed at [facility], [address] [place], [place] [address]     Assessment: Julie Winters is a 37 y.o. [phone] at [redacted]w[redacted]d here for IOL for IUGR  #Labor: Start with buccal cytotec given IUGR status. Pt hesitant about FB at this time #Pain: IV pain meds PRN, epidural upon request #FHT: Category I #GBS/ID: Negative #MOF: breast feeding #MOC: bilateral tubal ligation  #Anemia: Hgb 9 at admit. TXA on hand at delivery  Joanne Gavel, MD [facility] Fellow Center for [facility], [facility]  01/21/2023, 2:05 AM

## 2023-01-21 NOTE — Discharge Summary (Signed)
Postpartum Discharge Summary       Patient Name: Julie Winters DOB: 04-02-1986 MRN: [id number]  Date of admission: 01/21/2023 Delivery date:01/21/2023 Delivering provider: Cam Hai D Date of discharge: 01/23/2023  Admitting diagnosis: IUGR (intrauterine growth restriction) affecting care of mother [O36.5990] Intrauterine pregnancy: [redacted]w[redacted]d     Secondary diagnosis:  Principal Problem:   IUGR (intrauterine growth restriction) affecting care of mother Active Problems:   Genetic carrier status   History of retained placenta   Supervision of high risk pregnancy, antepartum   Current pregnancy with history of spontaneous abortion during second trimester of prior pregnancy   Advanced maternal age in multigravida   History of IUFD  Additional problems: none    Discharge diagnosis: Term Pregnancy Delivered                                              Post partum procedures: Augmentation: AROM, Pitocin, Cytotec, and IP Foley Complications: None  Hospital course: Induction of Labor With Vaginal Delivery   37 y.o. yo [phone] at [redacted]w[redacted]d was admitted to the hospital 01/21/2023 for induction of labor.  Indication for induction:  FGR (8th%) .  Patient had an labor course complicated by some episodes of fetal decels requiring internal monitoring and an amnioinfusion, however she continued to make progress and FHR improved. Membrane Rupture Time/Date: 5:02 PM,01/21/2023  Delivery Method:Vaginal, Spontaneous Operative Delivery:N/A Episiotomy: None Lacerations:  None Details of delivery can be found in separate delivery note.  Patient had a postpartum course complicated by nothing. Patient is discharged home 01/23/23.  Newborn Data: Birth date:01/21/2023 Birth time:11:02 PM Gender:Female Living status:Living Apgars:6 ,9  Weight:2760 g (6lb 1.4oz)  Magnesium Sulfate received: No BMZ received: No Rhophylac:N/A MMR:N/A T-DaP: offered postpartum Flu: No RSV Vaccine received:  No Transfusion:No  Immunizations received: There is no immunization history for the selected administration types on file for this patient.  Physical exam  Vitals:   01/22/23 0602 01/22/23 1020 01/22/23 1437 01/22/23 2052  BP: 93/66 130/82 112/62 111/65  Pulse: 75 68 67 63  Resp: 16 18 18 18   Temp: 98.1 F (36.7 C) 98.3 F (36.8 C) 97.7 F (36.5 C) 98.5 F (36.9 C)  TempSrc: Oral Oral Oral Oral  SpO2:  100% 99% 100%  Weight:      Height:       General: alert, cooperative, and no distress Lochia: appropriate Uterine Fundus: firm Incision: N/A DVT Evaluation: No evidence of DVT seen on physical exam. Negative Homan's sign. No cords or calf tenderness. No significant calf/ankle edema. Labs: Lab Results  Component Value Date   WBC 8.2 01/21/2023   HGB 9.0 (L) 01/21/2023   HCT 29.2 (L) 01/21/2023   MCV 83.9 01/21/2023   PLT 168 01/21/2023      Latest Ref Rng & Units 01/12/2023    3:56 PM  CMP  Glucose 70 - 99 mg/dL 76   BUN 6 - 20 mg/dL 5   Creatinine 4.69 - 6.29 mg/dL 5.28   Sodium 413 - 244 mmol/L 139   Potassium 3.5 - 5.2 mmol/L 3.9   Chloride 96 - 106 mmol/L 106   CO2 20 - 29 mmol/L 18   Calcium 8.7 - 10.2 mg/dL 9.1   Total Protein 6.0 - 8.5 g/dL 6.2   Total Bilirubin 0.0 - 1.2 mg/dL 0.4   Alkaline Phos 44 - 121  IU/L 163   AST 0 - 40 IU/L 19   ALT 0 - 32 IU/L 12    Edinburgh Score:    01/22/2023    1:03 AM  Edinburgh Postnatal Depression Scale Screening Tool  I have been able to laugh and see the funny side of things. 0  I have looked forward with enjoyment to things. 2  I have blamed myself unnecessarily when things went wrong. 0  I have been anxious or worried for no good reason. 2  I have felt scared or panicky for no good reason. 2  Things have been getting on top of me. 0  I have been so unhappy that I have had difficulty sleeping. 0  I have felt sad or miserable. 0  I have been so unhappy that I have been crying. 0  The thought of harming  myself has occurred to me. 0  Edinburgh Postnatal Depression Scale Total 6   Edinburgh Postnatal Depression Scale Total: 6   After visit meds:  Allergies as of 01/23/2023   No Known Allergies      Medication List     STOP taking these medications    aspirin EC 81 MG tablet   hydrocortisone 25 MG suppository Commonly known as: ANUSOL-HC   pantoprazole 40 MG tablet Commonly known as: Protonix   witch hazel-glycerin pad Commonly known as: TUCKS       TAKE these medications    acetaminophen 325 MG tablet Commonly known as: Tylenol Take 2 tablets (650 mg total) by mouth every 4 (four) hours as needed (for pain scale < 4).   ibuprofen 600 MG tablet Commonly known as: ADVIL Take 1 tablet (600 mg total) by mouth every 6 (six) hours.   Vitafol Ultra 29-0.6-0.4-200 MG Caps Take 1 capsule by mouth daily before breakfast.         Discharge home in stable condition Infant Feeding: Breast Infant Disposition:home with mother Discharge instruction: per After Visit Summary and Postpartum booklet. Activity: Advance as tolerated. Pelvic rest for 6 weeks.  Diet: routine diet Future Appointments: Future Appointments  Date Time Provider Department Center  03/09/2023 10:15 AM Adam Phenix, MD [facility] None   Follow up Visit:  Arabella Merles, CNM  P [facility] Please schedule this patient for Postpartum visit in: 6 weeks with the following provider: Any provider In-Person For C/S patients schedule nurse incision check in weeks 2 weeks: no High risk pregnancy complicated by: FGR Delivery mode:  SVD Anticipated Birth Control:  IUD @ PP visit PP Procedures needed: none Schedule Integrated BH visit: no   01/23/2023 Jacklyn Shell, CNM

## 2023-01-21 NOTE — Progress Notes (Signed)
LABOR PROGRESS NOTE  Patient Name: Julie Winters, female   DOB: 28-Jan-1986, 37 y.o.  MRN: [id number]  [id number] at [redacted]w[redacted]d admitted for IOL for IUGR  S: Feeling mild cramping  O:  BP 114/85 (BP Location: Left Arm)   Pulse 81   Temp 97.8 F (36.6 C) (Oral)   Resp 17   Ht 5\' 7"  (1.702 m)   Wt 91.2 kg   LMP 04/27/2022 (Exact Date)   BMI 31.50 kg/m  EFM:135 bpm/Moderate variability/ 15x15 accels/ None decels CAT: 1 Toco: UI   CVE: Dilation: 1.5 Effacement (%): Thick Station: Ballotable Presentation: Vertex Exam by:: Brynda Greathouse, RN   A&P:   #Labor: Continue with cervical ripening. 2nd dose of cytotec placed. Pt prefers to avoid FB #Pain: Per pt preference #FWB: CAT 1 #GBS negative #Anticipate vaginal delivery  Joanne Gavel, MD FMOB Fellow, Faculty practice [facility], Center for [facility] 01/21/23  7:56 AM

## 2023-01-21 NOTE — Anesthesia Procedure Notes (Signed)
Epidural Patient location during procedure: OB Start time: 01/21/2023 6:11 PM End time: 01/21/2023 6:14 PM  Staffing Anesthesiologist: Kaylyn Layer, MD Performed: anesthesiologist   Preanesthetic Checklist Completed: patient identified, IV checked, risks and benefits discussed, monitors and equipment checked, pre-op evaluation and timeout performed  Epidural Patient position: sitting Prep: DuraPrep and site prepped and draped Patient monitoring: continuous pulse ox, blood pressure and heart rate Approach: midline Location: L3-L4 Injection technique: LOR air  Needle:  Needle type: Tuohy  Needle gauge: 17 G Needle length: 9 cm Needle insertion depth: 5 cm Catheter type: closed end flexible Catheter size: 19 Gauge Catheter at skin depth: 10 cm Test dose: negative and Other (1% lidocaine)  Assessment Events: blood not aspirated, no cerebrospinal fluid, injection not painful, no injection resistance, no paresthesia and negative IV test  Additional Notes Patient identified. Risks, benefits, and alternatives discussed with patient including but not limited to bleeding, infection, nerve damage, paralysis, failed block, incomplete pain control, headache, blood pressure changes, nausea, vomiting, reactions to medication, itching, and postpartum back pain. Confirmed with bedside nurse the patient's most recent platelet count. Confirmed with patient that they are not currently taking any anticoagulation, have any bleeding history, or any family history of bleeding disorders. Patient expressed understanding and wished to proceed. All questions were answered. Sterile technique was used throughout the entire procedure. Please see nursing notes for vital signs.   Crisp LOR on first pass. Test dose was given through epidural catheter and negative prior to continuing to dose epidural or start infusion. Warning signs of high block given to the patient including shortness of breath,  tingling/numbness in hands, complete motor block, or any concerning symptoms with instructions to call for help. Patient was given instructions on fall risk and not to get out of bed. All questions and concerns addressed with instructions to call with any issues or inadequate analgesia.  Reason for block:procedure for pain

## 2023-01-21 NOTE — Anesthesia Preprocedure Evaluation (Addendum)
Anesthesia Evaluation  Patient identified by MRN, date of birth, ID band Patient awake    Reviewed: Allergy & Precautions, Patient's Chart, lab work & pertinent test results  History of Anesthesia Complications Negative for: history of anesthetic complications  Airway Mallampati: II  TM Distance: >3 FB Neck ROM: Full    Dental no notable dental hx.    Pulmonary neg pulmonary ROS   Pulmonary exam normal        Cardiovascular negative cardio ROS Normal cardiovascular exam     Neuro/Psych negative neurological ROS  negative psych ROS   GI/Hepatic negative GI ROS, Neg liver ROS,,,  Endo/Other  negative endocrine ROS    Renal/GU negative Renal ROS  negative genitourinary   Musculoskeletal negative musculoskeletal ROS (+)    Abdominal   Peds  Hematology  (+) Blood dyscrasia (Hgb 9.0), anemia   Anesthesia Other Findings Day of surgery medications reviewed with patient.  Reproductive/Obstetrics (+) Pregnancy                             Anesthesia Physical Anesthesia Plan  ASA: 3  Anesthesia Plan: Epidural   Post-op Pain Management:    Induction:   PONV Risk Score and Plan: Treatment may vary due to age or medical condition  Airway Management Planned: Natural Airway  Additional Equipment: Fetal Monitoring  Intra-op Plan:   Post-operative Plan:   Informed Consent: I have reviewed the patients History and Physical, chart, labs and discussed the procedure including the risks, benefits and alternatives for the proposed anesthesia with the patient or authorized representative who has indicated his/her understanding and acceptance.       Plan Discussed with:   Anesthesia Plan Comments:         Anesthesia Quick Evaluation

## 2023-01-21 NOTE — Progress Notes (Signed)
LABOR PROGRESS NOTE  Patient Name: Alaysha Jefcoat, female   DOB: Aug 19, 1985, 37 y.o.  MRN: [id number]  [id number] at [redacted]w[redacted]d admitted for IOL for IUGR  S: Feeling mild cramping, sleeping through them.   O:  BP 114/85 (BP Location: Left Arm)   Pulse 81   Temp 97.8 F (36.6 C) (Oral)   Resp 17   Ht 5\' 7"  (1.702 m)   Wt 91.2 kg   LMP 04/27/2022 (Exact Date)   BMI 31.50 kg/m  EFM:140 bpm/Moderate variability/ 15x15 accels/ None decels CAT: 1 Toco: every 3 min   CVE: Dilation: 1.5 Effacement (%): Thick Station: Ballotable Presentation: Vertex Exam by:: Brynda Greathouse, RN   A&P:   #Labor: Will repeat dose of Cytotec, pt declining FB at this time #Pain: Per pt preference #FWB: CAT 1 #GBS negative #Anticipate vaginal delivery  Sundra Aland, MD FMOB Fellow, Faculty practice [facility], Center for [facility] 01/21/23  10:34 AM

## 2023-01-21 NOTE — Progress Notes (Signed)
LABOR PROGRESS NOTE  Patient Name: Julie Winters, female   DOB: 02-Jul-1985, 37 y.o.  MRN: [id number]  [id number] at [redacted]w[redacted]d admitted for IOL for IUGR  S: No concerns. FB out.  O:  BP 109/69   Pulse 87   Temp 98.1 F (36.7 C) (Oral)   Resp 16   Ht 5\' 7"  (1.702 m)   Wt 91.2 kg   LMP 04/27/2022 (Exact Date)   BMI 31.50 kg/m  EFM: 130/mod/+a/-d CAT: 1 Toco: every 3-5 min   CVE: Dilation: 3 Effacement (%): 50 Station: -3 Presentation: Vertex Exam by:: Dr. Lucianne Muss   A&P:   #Labor: Discussed AROM in detail w patient, she verbally consents to AROM at this time  AROM with clear fluid #Pain: IV pain meds #FWB: CAT 1 #GBS negative #Anticipate vaginal delivery  Sundra Aland, MD FMOB Fellow, Faculty practice [facility], Center for [facility] 01/21/23  5:37 PM

## 2023-01-21 NOTE — Progress Notes (Signed)
Labor progress:  Reviewed patient's FHT. 125/mod variability/+accels/variables with more than half of ctx. Ctx Q6 minutes. Cat II  Restart pit 2x2. Continue amnioinfusion. As long as FHT reassuring between ctx with quick recovery, will continue. Repeat cervical check 1-2 hours after pit.  Joanne Gavel, MD

## 2023-01-21 NOTE — Progress Notes (Signed)
LABOR PROGRESS NOTE  Patient Name: Julie Winters, female   DOB: 12-18-1985, 37 y.o.  MRN: [id number]  [id number] at [redacted]w[redacted]d admitted for IOL for IUGR  S: Ctx more close together, getting more intense.  O:  BP 112/77   Pulse 85   Temp 98 F (36.7 C) (Oral)   Resp 16   Ht 5\' 7"  (1.702 m)   Wt 91.2 kg   LMP 04/27/2022 (Exact Date)   BMI 31.50 kg/m  EFM: 135/mod/+a/-d CAT: 1 Toco: every 3-5 min   CVE: Dilation: 1.5 Effacement (%): 50 Station: -3 Presentation: Vertex Exam by:: Wynelle Cleveland, RN   A&P:   #Labor: Discussed FB in detail with patient -- she verbally consented to placement of FB given cx unchanged from previous exam  Cook cath placed under speculum guidance, will start on low dose pitocin #Pain: IV pain meds #FWB: CAT 1 #GBS negative #Anticipate vaginal delivery  Sundra Aland, MD FMOB Fellow, Faculty practice [facility], Center for [facility] 01/21/23  4:49 PM

## 2023-01-21 NOTE — Progress Notes (Signed)
LABOR PROGRESS NOTE  Patient Name: Julie Winters, female   DOB: Aug 17, 1985, 37 y.o.  MRN: [id number]  [id number] at [redacted]w[redacted]d admitted for IOL for IUGR  S: Called to bedside for prolonged decel -- fetal bradycardia ~5 min down to 60s. On my arrival, pt on right lateral decubitus with FHR in the 120s.   O:  BP 109/69   Pulse 87   Temp 98.1 F (36.7 C) (Oral)   Resp 16   Ht 5\' 7"  (1.702 m)   Wt 91.2 kg   LMP 04/27/2022 (Exact Date)   BMI 31.50 kg/m  EFM: 120/mod/-d/early vs variables Toco: every 3-5 min   CVE: Dilation: 4 Effacement (%): 80 Cervical Position: Posterior Station: -3 Presentation: Vertex Exam by:: A Showfety RN   A&P:   #Labor: Prolonged decel improved w positional chagnes, will cont to watch closely -- suspect pt will begin to have change in cx exam #Pain: Epidural #FWB: CAT 1 #GBS negative #Anticipate vaginal delivery  Sundra Aland, MD FMOB Fellow, Faculty practice [facility], Center for [facility] 01/21/23  6:06 PM

## 2023-01-21 NOTE — Progress Notes (Signed)
LABOR PROGRESS NOTE  Called to bedside again for deep variables down to 70s with contractions. Otherwise, good variability on strip. Will give a dose of Terbutaline now and reassess. If continues to have variables, will consider IUPC and amnioinfusion.   Sundra Aland, MD OB Fellow, Faculty Practice [facility], Center for [facility]

## 2023-01-22 ENCOUNTER — Encounter (HOSPITAL_COMMUNITY): Payer: Self-pay | Admitting: Obstetrics and Gynecology

## 2023-01-22 MED ORDER — ONDANSETRON HCL 4 MG/2ML IJ SOLN: 4.0000 mg | INTRAMUSCULAR | Status: DC | PRN

## 2023-01-22 MED ORDER — SENNOSIDES-DOCUSATE SODIUM 8.6-50 MG PO TABS
2.0000 | ORAL_TABLET | ORAL | Status: DC
  Administered 2023-01-22 – 2023-01-23 (×2): 2 via ORAL
  Filled 2023-01-22 (×2): qty 2

## 2023-01-22 MED ORDER — TETANUS-DIPHTH-ACELL PERTUSSIS 5-2.5-18.5 LF-MCG/0.5 IM SUSY: 0.5000 mL | PREFILLED_SYRINGE | Freq: Once | INTRAMUSCULAR | Status: DC

## 2023-01-22 MED ORDER — COCONUT OIL OIL: 1.0000 | TOPICAL_OIL | Status: DC | PRN

## 2023-01-22 MED ORDER — WITCH HAZEL-GLYCERIN EX PADS: 1.0000 | MEDICATED_PAD | CUTANEOUS | Status: DC | PRN

## 2023-01-22 MED ORDER — PRENATAL MULTIVITAMIN CH
1.0000 | ORAL_TABLET | Freq: Every day | ORAL | Status: DC
  Administered 2023-01-22 – 2023-01-23 (×2): 1 via ORAL
  Filled 2023-01-22 (×2): qty 1

## 2023-01-22 MED ORDER — BENZOCAINE-MENTHOL 20-0.5 % EX AERO: 1.0000 | INHALATION_SPRAY | CUTANEOUS | Status: DC | PRN

## 2023-01-22 MED ORDER — ACETAMINOPHEN 325 MG PO TABS: 650.0000 mg | ORAL_TABLET | ORAL | Status: DC | PRN

## 2023-01-22 MED ORDER — ZOLPIDEM TARTRATE 5 MG PO TABS: 5.0000 mg | ORAL_TABLET | Freq: Every evening | ORAL | Status: DC | PRN

## 2023-01-22 MED ORDER — DIPHENHYDRAMINE HCL 25 MG PO CAPS: 25.0000 mg | ORAL_CAPSULE | Freq: Four times a day (QID) | ORAL | Status: DC | PRN

## 2023-01-22 MED ORDER — IBUPROFEN 600 MG PO TABS
600.0000 mg | ORAL_TABLET | Freq: Four times a day (QID) | ORAL | Status: DC
  Administered 2023-01-22 – 2023-01-23 (×7): 600 mg via ORAL
  Filled 2023-01-22 (×7): qty 1

## 2023-01-22 MED ORDER — ONDANSETRON HCL 4 MG PO TABS: 4.0000 mg | ORAL_TABLET | ORAL | Status: DC | PRN

## 2023-01-22 MED ORDER — MEASLES, MUMPS & RUBELLA VAC IJ SOLR: 0.5000 mL | Freq: Once | INTRAMUSCULAR | Status: DC

## 2023-01-22 MED ORDER — SIMETHICONE 80 MG PO CHEW: 80.0000 mg | CHEWABLE_TABLET | ORAL | Status: DC | PRN

## 2023-01-22 MED ORDER — OXYCODONE HCL 5 MG PO TABS: 5.0000 mg | ORAL_TABLET | ORAL | Status: DC | PRN

## 2023-01-22 MED ORDER — DIBUCAINE (PERIANAL) 1 % EX OINT: 1.0000 | TOPICAL_OINTMENT | CUTANEOUS | Status: DC | PRN

## 2023-01-22 NOTE — Anesthesia Postprocedure Evaluation (Signed)
Anesthesia Post Note  Patient: Julie Winters  Procedure(s) Performed: AN AD HOC LABOR EPIDURAL     Patient location during evaluation: Mother Baby Anesthesia Type: Epidural Level of consciousness: awake, oriented and awake and alert Pain management: pain level controlled Vital Signs Assessment: post-procedure vital signs reviewed and stable Respiratory status: spontaneous breathing, respiratory function stable and nonlabored ventilation Cardiovascular status: stable Postop Assessment: no headache, adequate PO intake, able to ambulate, patient able to bend at knees and no apparent nausea or vomiting Anesthetic complications: no   No notable events documented.  Last Vitals:  Vitals:   01/22/23 0158 01/22/23 0602  BP: (!) 102/59 93/66  Pulse: 72 75  Resp: 16 16  Temp: 36.6 C 36.7 C  SpO2:      Last Pain:  Vitals:   01/22/23 0602  TempSrc: Oral  PainSc:    Pain Goal:                   [name]

## 2023-01-22 NOTE — Progress Notes (Signed)
POSTPARTUM PROGRESS NOTE  Post Partum Day 1  Subjective:  Julie Winters is a 37 y.o. [id number] s/p SVD at [redacted]w[redacted]d.  She reports she is doing well. No acute events overnight. She denies any problems with ambulating, voiding or po intake. Denies nausea or vomiting.  Pain is well controlled.  Lochia is appropriate.  Objective: Blood pressure 93/66, pulse 75, temperature 98.1 F (36.7 C), temperature source Oral, resp. rate 16, height 5\' 7"  (1.702 m), weight 91.2 kg, last menstrual period 04/27/2022, SpO2 100%, unknown if currently breastfeeding.  Physical Exam:  General: alert, cooperative and no distress Chest: no respiratory distress Heart:regular rate, distal pulses intact Abdomen: soft, nontender,  Uterine Fundus: firm, appropriately tender DVT Evaluation: No calf swelling or tenderness Extremities: no edema Skin: warm, dry  Recent Labs    01/21/23 0058  HGB 9.0*  HCT 29.2*    Assessment/Plan: Julie Winters is a 37 y.o. [id number] s/p SVD at [redacted]w[redacted]d   PPD#1 - Doing well  Routine postpartum care  Contraception: OP IUD Feeding: breast Dispo: Plan for discharge tomorrow.   LOS: 1 day   Derrel Nip, MD Attending Family Medicine Physician, [facility] for [facility], [facility]  01/22/23 10:28 AM

## 2023-01-22 NOTE — Lactation Note (Signed)
This note was copied from a baby's chart. Lactation Consultation Note  Patient Name: Julie Winters [id number] Date: 01/22/2023 Age:35 hours Reason for consult: Initial assessment;Early term 57-38.6wks  P3, Mother states she plans to breastfeed and formula feed.  Due to infant's size, suggest supplementing with LPI recommended volume guidelines. Feed on demand with cues.  Goal 8-12+ times per day after first 24 hrs.  Place baby STS if not cueing. Wake baby is she has not fed in 3 hours. Suggest calling for latch assistance today as needed.    Maternal Data Has patient been taught Hand Expression?: Yes Does the patient have breastfeeding experience prior to this delivery?: Yes How long did the patient breastfeed?: 8 mos and 18 mos.  Feeding Mother's Current Feeding Choice: Breast Milk and Formula  Interventions Interventions: Education;Breast feeding basics reviewed  Discharge Pump: Personal;DEBP (Medela)  Consult Status Consult Status: Follow-up Date: 01/23/23 Follow-up type: In-patient    [name] [facility] 01/22/2023, 10:02 AM

## 2023-01-23 MED ORDER — ACETAMINOPHEN 325 MG PO TABS: 650.0000 mg | ORAL_TABLET | ORAL | Status: AC | PRN

## 2023-01-23 MED ORDER — IBUPROFEN 600 MG PO TABS: 600.0000 mg | ORAL_TABLET | Freq: Four times a day (QID) | ORAL | 0 refills | Status: AC

## 2023-01-23 NOTE — Lactation Note (Signed)
This note was copied from a baby's chart. Lactation Consultation Note  Patient Name: Julie Winters Date: 01/23/2023 Age:35 hours Reason for consult: Follow-up assessment;Early term 37-38.6wks;Infant < 6lbs  P3- MOB states that infant is feeding well both on the breasts and the bottle. Per MOB, she has been following LPI/low weight feeding plan and has no questions or concerns about it. MOB informed LC that her nipples are a little sore. LC offered to provided her with coconut oil, but MOB declined.  LC reviewed CDC milk storage guidelines, LC services handout and engorgement/breast care protocols. LC encouraged MOB to call lactation for assistance as needed.  Feeding Mother's Current Feeding Choice: Breast Milk and Formula Nipple Type: Slow - flow  Lactation Tools Discussed/Used Pump Education: Milk Storage  Interventions Interventions: Breast feeding basics reviewed;Education;LC Services brochure  Discharge Discharge Education: Engorgement and breast care;Warning signs for feeding baby  Consult Status Consult Status: Complete Date: 01/23/23    Dema Severin BS, IBCLC 01/23/2023, 8:20 AM

## 2023-01-26 ENCOUNTER — Encounter: Payer: Medicaid Other | Admitting: Obstetrics and Gynecology

## 2023-02-02 ENCOUNTER — Encounter: Payer: Medicaid Other | Admitting: Advanced Practice Midwife

## 2023-02-14 ENCOUNTER — Telehealth (HOSPITAL_COMMUNITY): Payer: Self-pay | Admitting: *Deleted

## 2023-02-14 NOTE — Telephone Encounter (Signed)
02/14/2023  Name: Julie Winters MRN: [id number] DOB: [birth year]  Reason for Call:  Transition of Care Hospital Discharge Call  Contact Status: Patient Contact Status: Complete  Language assistant needed:          Follow-Up Questions: Do You Have Any Concerns About Your Health As You Heal From Delivery?: No Do You Have Any Concerns About Your Infants Health?: No  Edinburgh Postnatal Depression Scale:  In the Past 7 Days:   EPDS not completed. Patient reported completing EPDS with home visiting nurse. No areas of concern identified. Patient stated, "I am doing well." PHQ2-9 Depression Scale:     Discharge Follow-up:    Post-discharge interventions: Reviewed Newborn Safe Sleep Practices  Signature Deforest Hoyles, RN, 02/14/23, [phone]

## 2023-03-09 ENCOUNTER — Ambulatory Visit (INDEPENDENT_AMBULATORY_CARE_PROVIDER_SITE_OTHER): Payer: Medicaid Other | Admitting: Obstetrics & Gynecology

## 2023-03-09 NOTE — Progress Notes (Signed)
    Post Partum Visit Note  Julie Winters is a 37 y.o. [phone] female who presents for a postpartum visit. She is 6 weeks postpartum following a normal spontaneous vaginal delivery.  I have fully reviewed the prenatal and intrapartum course. The delivery was at 38.3 gestational weeks.  Anesthesia: epidural. Postpartum course has been good. Baby is doing well. Baby is feeding by both breast and bottle - Similac Pure Bliss . Bleeding staining only. Bowel function is normal. Bladder function is normal. Patient is not sexually active. Contraception method is abstinence.   Postpartum depression screening: negative.   The pregnancy intention screening data noted above was reviewed. Potential methods of contraception were discussed. The patient elected to proceed with Nexplanon and she will schedule insertion    Health Maintenance Due  Topic Date Due   DTaP/Tdap/Td (1 - Tdap) Never done   INFLUENZA VACCINE  Never done   COVID-19 Vaccine (3 - 2023-24 season) 12/14/2022    The following portions of the patient's history were reviewed and updated as appropriate: allergies, current medications, past family history, past medical history, past social history, past surgical history, and problem list.  Review of Systems Pertinent items are noted in HPI.  Objective:  LMP 04/27/2022 (Exact Date)    General:  alert, cooperative, and no distress   Breasts:  not indicated  Lungs:   Heart:  regular rate and rhythm  Abdomen: soft, non-tender; bowel sounds normal; no masses,  no organomegaly   Wound   GU exam:  not indicated       Assessment:    There are no diagnoses linked to this encounter.  normal postpartum exam.   Plan:   Essential components of care per ACOG recommendations:  1.  Mood and well being: Patient with negative depression screening today. Reviewed local resources for support.  - Patient tobacco use? No.   - hx of drug use? Yes. Discussed support systems and  outpatient/inpatient treatment options.    2. Infant care and feeding:  -Patient currently breastmilk feeding? Yes. Discussed returning to work and pumping.  -Social determinants of health (SDOH) reviewed in EPIC. No concerns  3. Sexuality, contraception and birth spacing - Patient does not want a pregnancy in the next year.  Desired family size is 3 children.  - Reviewed reproductive life planning. Reviewed contraceptive methods based on pt preferences and effectiveness.  Patient desired Hormonal Implant today.   - Discussed birth spacing of 18 months  4. Sleep and fatigue -Encouraged family/partner/community support of 4 hrs of uninterrupted sleep to help with mood and fatigue  5. Physical Recovery  - Discussed patients delivery and complications. She describes her labor as good. - Patient had a Vaginal, no problems at delivery. Patient had no laceration. Perineal healing reviewed. Patient expressed understanding - Patient has urinary incontinence? No. - Patient is safe to resume physical and sexual activity  6.  Health Maintenance - HM due items addressed Yes - Last pap smear  Diagnosis  Date Value Ref Range Status  11/06/2021   Final   - Negative for intraepithelial lesion or malignancy (NILM)   Pap smear not done at today's visit.  -Breast Cancer screening indicated? No.   7. Chronic Disease/Pregnancy Condition follow up: None  - PCP follow up  Adam Phenix, MD Center for [facility], [facility]

## 2023-04-10 ENCOUNTER — Ambulatory Visit: Payer: Medicaid Other

## 2023-04-20 ENCOUNTER — Ambulatory Visit: Payer: Medicaid Other | Admitting: *Deleted

## 2023-04-20 ENCOUNTER — Ambulatory Visit: Payer: Medicaid Other | Admitting: Advanced Practice Midwife

## 2023-04-20 VITALS — BP 112/77 | HR 65 | Ht 67.0 in | Wt 181.2 lb

## 2023-04-20 VITALS — BP 124/76 | HR 71 | Ht 67.0 in | Wt 182.0 lb

## 2023-04-20 DIAGNOSIS — R300 Dysuria: Secondary | ICD-10-CM | POA: Diagnosis not present

## 2023-04-20 DIAGNOSIS — Z3202 Encounter for pregnancy test, result negative: Secondary | ICD-10-CM | POA: Diagnosis not present

## 2023-04-20 DIAGNOSIS — Z30017 Encounter for initial prescription of implantable subdermal contraceptive: Secondary | ICD-10-CM | POA: Diagnosis not present

## 2023-04-20 LAB — POCT URINALYSIS DIPSTICK
Appearance: NORMAL
Bilirubin, UA: NEGATIVE
Bilirubin, UA: NEGATIVE
Blood, UA: NEGATIVE
Blood, UA: NEGATIVE
Glucose, UA: NEGATIVE
Glucose, UA: NEGATIVE
Ketones, UA: NEGATIVE
Leukocytes, UA: NEGATIVE
Leukocytes, UA: NEGATIVE
Nitrite, UA: NEGATIVE
Nitrite, UA: NEGATIVE
Odor: NORMAL
Protein, UA: POSITIVE — AB
Protein, UA: NEGATIVE
Spec Grav, UA: 1.02 (ref 1.010–1.025)
Spec Grav, UA: 1.015 (ref 1.010–1.025)
Urobilinogen, UA: 0.2 U/dL
Urobilinogen, UA: 1 U/dL
pH, UA: 5.5 (ref 5.0–8.0)
pH, UA: 6 (ref 5.0–8.0)

## 2023-04-20 LAB — POCT URINE PREGNANCY: Preg Test, Ur: NEGATIVE

## 2023-04-20 MED ORDER — ETONOGESTREL 68 MG ~~LOC~~ IMPL
68.0000 mg | DRUG_IMPLANT | Freq: Once | SUBCUTANEOUS | Status: AC
Start: 2023-04-20 — End: 2023-04-20
  Administered 2023-04-20: 68 mg via SUBCUTANEOUS

## 2023-04-20 NOTE — Progress Notes (Signed)
   Nexplanon  Insertion Procedure Patient identified, informed consent performed, consent signed.   Patient does understand that irregular bleeding is a very common side effect of this medication. She was advised to have backup contraception for one week after placement. Pregnancy test in clinic today was negative.  Appropriate time out taken.  Patient's left arm was prepped and draped in the usual sterile fashion.. The ruler used to measure and mark insertion area.  Patient was prepped with alcohol swab and then injected with 3 ml of 1% lidocaine .  She was prepped with betadine, Nexplanon  removed from packaging,  Device confirmed in needle, then inserted full length of needle and withdrawn per handbook instructions. Nexplanon  was able to palpated in the patient's Right arm; patient palpated the insert herself. There was minimal blood loss.  Patient insertion site covered with guaze and a pressure bandage to reduce any bruising.  The patient tolerated the procedure well and was given post procedure instructions.

## 2023-04-20 NOTE — Progress Notes (Signed)
 SUBJECTIVE: Julie Winters is a 38 y.o. female who complains of urinary frequency, urgency and dysuria x a few months (since delivery), without flank pain, fever, chills, or abnormal vaginal discharge or bleeding.   OBJECTIVE: Appears well, in no apparent distress.  Vital signs are normal. Urine dipstick shows positive for protein and positive for ketones.    ASSESSMENT: Dysuria  PLAN: Treatment per orders.  Call or return to clinic prn if these symptoms worsen or fail to improve as anticipated.

## 2023-04-20 NOTE — Progress Notes (Signed)
 Desires Nexplanon today. Hx use prior but bled. Wants to discuss this before insertion. Denies intercourse since delivery on 01/21/2023. UPT Neg

## 2023-04-20 NOTE — Addendum Note (Signed)
 Addended by: Maretta Bees on: 04/20/2023 12:20 PM   Modules accepted: Orders

## 2023-04-20 NOTE — Addendum Note (Signed)
 Addended by: Marcell Barlow on: 04/20/2023 12:25 PM   Modules accepted: Orders

## 2023-04-20 NOTE — Addendum Note (Signed)
 Addended by: Lyndee Hensen on: 04/20/2023 12:34 PM   Modules accepted: Orders

## 2023-04-21 ENCOUNTER — Ambulatory Visit: Payer: Medicaid Other | Admitting: Family Medicine

## 2023-04-22 LAB — URINE CULTURE: Organism ID, Bacteria: NO GROWTH

## 2023-09-08 ENCOUNTER — Ambulatory Visit: Admitting: Certified Nurse Midwife

## 2023-09-08 ENCOUNTER — Encounter: Payer: Self-pay | Admitting: Certified Nurse Midwife

## 2023-09-08 VITALS — BP 115/77 | HR 69 | Ht 67.0 in | Wt 173.0 lb

## 2023-09-08 DIAGNOSIS — Z975 Presence of (intrauterine) contraceptive device: Secondary | ICD-10-CM

## 2023-09-08 DIAGNOSIS — Z124 Encounter for screening for malignant neoplasm of cervix: Secondary | ICD-10-CM | POA: Diagnosis not present

## 2023-09-08 DIAGNOSIS — Z3046 Encounter for surveillance of implantable subdermal contraceptive: Secondary | ICD-10-CM | POA: Diagnosis not present

## 2023-09-08 DIAGNOSIS — R42 Dizziness and giddiness: Secondary | ICD-10-CM

## 2023-09-08 DIAGNOSIS — Z01419 Encounter for gynecological examination (general) (routine) without abnormal findings: Secondary | ICD-10-CM | POA: Diagnosis not present

## 2023-09-08 DIAGNOSIS — R5383 Other fatigue: Secondary | ICD-10-CM

## 2023-09-08 DIAGNOSIS — R519 Headache, unspecified: Secondary | ICD-10-CM

## 2023-09-08 NOTE — Progress Notes (Signed)
 GYNECOLOGY OFFICE VISIT NOTE  History:    Julie Winters is a 38 y.o. [phone] here today for complaints of fatigue, headaches, and dizziness. She reports that she recently cut back on the amount of caffeine she has been having as well as reports that she is not sleeping well. She has a nexplanon  insert placed in January of the month and LMP was 2 weeks ago. She reports heavy bleeding but states that it was normal for her.  She denies any abnormal vaginal discharge, bleeding, pelvic pain or other concerns.    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   01-20-19 Antibody screen positive but weak and will repeat in 4 weeks  Previous antibody screen in 2015 was normal   Retained placenta 12/05/2021    Past Surgical History:  Procedure Laterality Date   DILATION AND CURETTAGE OF UTERUS N/A 12/05/2021   Procedure: DILATATION AND CURETTAGE;  Surgeon: Lacey Pian, MD;  Location: [facility] LD ORS;  Service: Gynecology;  Laterality: N/A;    The following portions of the patient's history were reviewed and updated as appropriate: allergies, current medications, past family history, past medical history, past social history, past surgical history and problem list.   Health Maintenance:  Normal pap and negative HRHPV on 10/17/21.   Review of Systems:  Pertinent items noted in HPI and remainder of comprehensive ROS otherwise negative.  Physical Exam:  BP 115/77   Pulse 69   Ht 5\' 7"  (1.702 m)   Wt 173 lb (78.5 kg)   LMP  (LMP Unknown)   BMI 27.10 kg/m  CONSTITUTIONAL: Well-developed, well-nourished female in no acute distress.  HEENT:  Normocephalic, atraumatic. External right and left ear normal. No scleral icterus.  NECK: Normal range of motion, supple, no masses noted on observation SKIN: No rash noted. Not diaphoretic. No erythema. No pallor. MUSCULOSKELETAL: Normal range of  motion. No edema noted. NEUROLOGIC: Alert and oriented to person, place, and time. Normal muscle tone coordination. No cranial nerve deficit noted. PSYCHIATRIC: Normal mood and affect. Normal behavior. Normal judgment and thought content. CARDIOVASCULAR: Normal heart rate noted RESPIRATORY: Effort and breath sounds normal, no problems with respiration noted ABDOMEN: No masses noted. No other overt distention noted.     Labs and Imaging No results found for this or any previous visit (from the past week). No results found.    Assessment and Plan:    1. Encounter for annual routine gynecological examination (Primary) - Patient overall doing well.   2. Nexplanon  in place - Discussed side effects that may occur with Nexplanon  and the presence of headaches.  - Offered the option to discontinue, patient declined at this time.    3. Pap smear for cervical cancer screening - Normal PAP 2 years ago. May defer for 1 additional year.   4. Intractable headache, unspecified chronicity pattern, unspecified headache type 5. Dizziness 6. Other fatigue - Concern for Anemia, CBC and CMP colleted today.  - Reviewed alternative possibilities for fatigue and headaches including the withdraws of caffeine, dehydration and lack of sleep.  - Encouraged patient to slowly decrease caffeine instead of stopping abruptly. - Recommended to have a night time routine in place for adequate rest and to decrease stimuli from the phone prior to bed.  - Also discussed normal wear and tear on body with infant at home.  - Recommended to follow up in 2-3 months to assess  lifestyle changes that contribute to symptoms. Patient agreeable to plan of care.    Routine preventative health maintenance measures emphasized. Please refer to After Visit Summary for other counseling recommendations.   Return in about 3 months (around 12/09/2023) for Check up .    I spent 30 minutes dedicated to the care of this patient including  pre-visit review of records, face to face time with the patient discussing her conditions and treatments and post visit orders.    Florence Antonelli Maurie Southern) Marlys Singh, MSN, CNM  Center for [facility]  09/08/23 4:56 PM

## 2023-09-08 NOTE — Progress Notes (Signed)
 Pt c/o headaches, dizziness and fatigue for several weeks

## 2023-09-09 LAB — COMPREHENSIVE METABOLIC PANEL WITH GFR
ALT: 23 IU/L (ref 0–32)
AST: 23 IU/L (ref 0–40)
Albumin: 4.5 g/dL (ref 3.9–4.9)
Alkaline Phosphatase: 91 IU/L (ref 44–121)
BUN/Creatinine Ratio: 10 (ref 9–23)
BUN: 10 mg/dL (ref 6–20)
Bilirubin Total: 0.5 mg/dL (ref 0.0–1.2)
CO2: 23 mmol/L (ref 20–29)
Calcium: 9.9 mg/dL (ref 8.7–10.2)
Chloride: 105 mmol/L (ref 96–106)
Creatinine, Ser: 0.96 mg/dL (ref 0.57–1.00)
Globulin, Total: 2.6 g/dL (ref 1.5–4.5)
Glucose: 67 mg/dL — ABNORMAL LOW (ref 70–99)
Potassium: 3.5 mmol/L (ref 3.5–5.2)
Sodium: 141 mmol/L (ref 134–144)
Total Protein: 7.1 g/dL (ref 6.0–8.5)
eGFR: 79 mL/min/{1.73_m2} (ref >59)

## 2023-09-09 LAB — CBC
Hematocrit: 42.6 % (ref 34.0–46.6)
Hemoglobin: 13.8 g/dL (ref 11.1–15.9)
MCH: 30.9 pg (ref 26.6–33.0)
MCHC: 32.4 g/dL (ref 31.5–35.7)
MCV: 95 fL (ref 79–97)
Platelets: 231 10*3/uL (ref 150–450)
RBC: 4.47 x10E6/uL (ref 3.77–5.28)
RDW: 12.4 % (ref 11.7–15.4)
WBC: 5.4 10*3/uL (ref 3.4–10.8)

## 2023-09-11 ENCOUNTER — Other Ambulatory Visit: Payer: Self-pay | Admitting: *Deleted

## 2023-09-11 DIAGNOSIS — Z01419 Encounter for gynecological examination (general) (routine) without abnormal findings: Secondary | ICD-10-CM

## 2023-09-11 NOTE — Progress Notes (Signed)
 [facility] referral placed for PHQ-9=7 and GAD-7=9 per [facility] guidelines.

## 2023-09-23 ENCOUNTER — Ambulatory Visit: Admitting: Licensed Clinical Social Worker

## 2023-09-29 NOTE — BH Specialist Note (Signed)
 Virtual link was sent to patient and a call was provided. Patient's phone was disconnected. Patient no showed for today's visit.

## 2024-03-24 ENCOUNTER — Ambulatory Visit: Admitting: Obstetrics

## 2024-03-25 NOTE — Progress Notes (Signed)
 Patient rescheduled.

## 2024-04-11 ENCOUNTER — Ambulatory Visit: Admitting: Obstetrics

## 2024-04-11 ENCOUNTER — Other Ambulatory Visit (HOSPITAL_COMMUNITY)
Admission: RE | Admit: 2024-04-11 | Discharge: 2024-04-11 | Disposition: A | Discharge status: Home / Self Care | Source: Ambulatory Visit | Attending: Obstetrics | Admitting: Obstetrics

## 2024-04-11 ENCOUNTER — Encounter: Payer: Self-pay | Admitting: Obstetrics

## 2024-04-11 VITALS — BP 112/83 | HR 62 | Ht 67.0 in | Wt 173.0 lb

## 2024-04-11 DIAGNOSIS — N898 Other specified noninflammatory disorders of vagina: Secondary | ICD-10-CM | POA: Insufficient documentation

## 2024-04-11 DIAGNOSIS — Z01419 Encounter for gynecological examination (general) (routine) without abnormal findings: Secondary | ICD-10-CM | POA: Diagnosis present

## 2024-04-11 DIAGNOSIS — Z3046 Encounter for surveillance of implantable subdermal contraceptive: Secondary | ICD-10-CM

## 2024-04-11 DIAGNOSIS — Z3009 Encounter for other general counseling and advice on contraception: Secondary | ICD-10-CM

## 2024-04-11 NOTE — Progress Notes (Unsigned)
 Pt presents for recurring vaginal discharge and itching.pt last pap was in 2023. Pt has no questions or concerns at this time.

## 2024-04-11 NOTE — Progress Notes (Unsigned)
 "  Subjective:        Julie Winters is a 38 y.o. female here for a routine exam.  Current complaints: Vaginal discharge with itching.    Personal health questionnaire:  Is patient Ashkenazi Jewish, have a family history of breast and/or ovarian cancer: no Is there a family history of uterine cancer diagnosed at age < 15, gastrointestinal cancer, urinary tract cancer, family member who is a Personnel Officer syndrome-associated carrier: no Is the patient overweight and hypertensive, family history of diabetes, personal history of gestational diabetes, preeclampsia or PCOS: no Is patient over 85, have PCOS,  family history of premature CHD under age 45, diabetes, smoke, have hypertension or peripheral artery disease:  no At any time, has a partner hit, kicked or otherwise hurt or frightened you?: no Over the past 2 weeks, have you felt down, depressed or hopeless?: no Over the past 2 weeks, have you felt little interest or pleasure in doing things?:no   Gynecologic History Patient's last menstrual period was 03/29/2024 (approximate). Contraception: Nexplanon  Last Pap: 2023. Results were: normal Last mammogram: n/a. Results were: n/a  Obstetric History OB History  Gravida Para Term Preterm AB Living  4 3 3  0 1 3  SAB IAB Ectopic Multiple Live Births  1 0  0 3    # Outcome Date GA Lbr Len/2nd Weight Sex Type Anes PTL Lv  4 Term 01/21/23 [redacted]w[redacted]d  6 lb 1.4 oz (2.76 kg) F Vag-Spont EPI  LIV  3 SAB 12/05/21 [redacted]w[redacted]d    Vag-Spont   FD  2 Term 08/01/19 [redacted]w[redacted]d 09:32 / 00:21 6 lb 13.2 oz (3.096 kg) M Vag-Vacuum EPI  LIV  1 Term 05/04/14 [redacted]w[redacted]d 15:01 / 01:55 6 lb 8.4 oz (2.96 kg) F Vag-Spont EPI  LIV    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   01-20-19 Antibody screen positive but weak and will repeat in 4 weeks  Previous antibody screen in 2015 was normal   Retained placenta 12/05/2021     Past Surgical History:  Procedure Laterality Date   DILATION AND CURETTAGE OF UTERUS N/A 12/05/2021   Procedure: DILATATION AND CURETTAGE;  Surgeon: Cleatus Moccasin, MD;  Location: [facility] LD ORS;  Service: Gynecology;  Laterality: N/A;    Current Medications[1] Allergies[2]  Social History   Tobacco Use   Smoking status: Never    Passive exposure: Never   Smokeless tobacco: Never  Substance Use Topics   Alcohol use: No    Alcohol/week: 0.0 standard drinks of alcohol    History reviewed. No pertinent family history.    Review of Systems  Constitutional: negative for fatigue and weight loss Respiratory: negative for cough and wheezing Cardiovascular: negative for chest pain, fatigue and palpitations Gastrointestinal: negative for abdominal pain and change in bowel habits Musculoskeletal:negative for myalgias Neurological: negative for gait problems and tremors Behavioral/Psych: negative for abusive relationship, depression Endocrine: negative for temperature intolerance    Genitourinary: positive for vaginal discharge with itching.  negative for abnormal menstrual periods, genital lesions, hot flashes, sexual problems  Integument/breast: negative for breast lump, breast tenderness, nipple discharge and skin lesion(s)    Objective:       BP 112/83   Pulse 62   Ht 5' 7 (1.702 m)   Wt 173 lb (78.5 kg)   LMP 03/29/2024 (Approximate)   BMI 27.10 kg/m  General:   Alert and no distress  Skin:  no rash or abnormalities  Lungs:   clear to auscultation bilaterally  Heart:   regular rate and rhythm, S1, S2 normal, no murmur, click, rub or gallop  Breasts:   normal without suspicious masses, skin or nipple changes or axillary nodes  Abdomen:  normal findings: no organomegaly, soft, non-tender and no hernia  Pelvis:  External genitalia: normal general appearance Urinary system: urethral meatus normal and bladder without fullness, nontender Vaginal: normal without tenderness,  induration or masses Cervix: normal appearance Adnexa: normal bimanual exam Uterus: anteverted and non-tender, normal size   Lab Review Urine pregnancy test Labs reviewed yes Radiologic studies reviewed no  I have spent a total of 20 minutes of face-to-face time, excluding clinical staff time, reviewing notes and preparing to see patient, ordering tests and/or medications, and counseling the patient.   Assessment:    1. Encounter for routine gynecological examination with Papanicolaou smear of cervix (Primary) Rx: - Cytology - PAP( [facility])  2. Vaginal discharge Rx: - Cervicovaginal ancillary only( [place]) - metroNIDAZOLE  (FLAGYL ) 500 MG tablet; Take 1 tablet (500 mg total) by mouth 2 (two) times daily.  Dispense: 14 tablet; Refill: 2 - fluconazole (DIFLUCAN) 150 MG tablet; Take 1 tablet (150 mg total) by mouth once for 1 dose.  Dispense: 1 tablet; Refill: 0  3. Encounter for surveillance of implantable subdermal contraceptive - doing well    Plan:    Education reviewed: calcium supplements, depression evaluation, low fat, low cholesterol diet, safe sex/STD prevention, self breast exams, and weight bearing exercise. Contraception: Nexplanon . Follow up in: 1 year.   Meds ordered this encounter  Medications   metroNIDAZOLE  (FLAGYL ) 500 MG tablet    Sig: Take 1 tablet (500 mg total) by mouth 2 (two) times daily.    Dispense:  14 tablet    Refill:  2   fluconazole (DIFLUCAN) 150 MG tablet    Sig: Take 1 tablet (150 mg total) by mouth once for 1 dose.    Dispense:  1 tablet    Refill:  0     CARLIN RONAL CENTERS MD, FACOG Attending Obstetrician & Gynecologist, [facility] for [facility], [facility], [facility] 04/11/2024     [1]  Current Outpatient Medications:    fluconazole (DIFLUCAN) 150 MG tablet, Take 1 tablet (150 mg total) by mouth once for 1 dose., Disp: 1 tablet, Rfl: 0   metroNIDAZOLE  (FLAGYL ) 500 MG tablet, Take 1  tablet (500 mg total) by mouth 2 (two) times daily., Disp: 14 tablet, Rfl: 2   acetaminophen  (TYLENOL ) 325 MG tablet, Take 2 tablets (650 mg total) by mouth every 4 (four) hours as needed (for pain scale < 4). (Patient not taking: Reported on 09/08/2023), Disp: , Rfl:    ibuprofen  (ADVIL ) 600 MG tablet, Take 1 tablet (600 mg total) by mouth every 6 (six) hours. (Patient not taking: Reported on 09/08/2023), Disp: 30 tablet, Rfl: 0   metroNIDAZOLE  (FLAGYL ) 500 MG tablet, Take 1 tablet (500 mg total) by mouth 2 (two) times daily., Disp: 14 tablet, Rfl: 2   Prenat-Fe Poly-Methfol-FA-DHA (VITAFOL  ULTRA) 29-0.6-0.4-200 MG CAPS, Take 1 capsule by mouth daily before breakfast. (Patient not taking: Reported on 09/08/2023), Disp: 90 capsule, Rfl: 4 [2] No Known Allergies  "

## 2024-04-12 LAB — CERVICOVAGINAL ANCILLARY ONLY
Bacterial Vaginitis (gardnerella): POSITIVE — AB
Candida Glabrata: NEGATIVE
Candida Vaginitis: NEGATIVE
Chlamydia: NEGATIVE
Comment: NEGATIVE
Comment: NEGATIVE
Comment: NEGATIVE
Comment: NEGATIVE
Comment: NEGATIVE
Comment: NORMAL
Neisseria Gonorrhea: NEGATIVE
Trichomonas: NEGATIVE

## 2024-04-13 LAB — CYTOLOGY - PAP
Comment: NEGATIVE
Diagnosis: NEGATIVE
Diagnosis: REACTIVE
High risk HPV: NEGATIVE

## 2024-04-14 ENCOUNTER — Ambulatory Visit: Payer: Self-pay | Admitting: Obstetrics

## 2024-04-14 DIAGNOSIS — B9689 Other specified bacterial agents as the cause of diseases classified elsewhere: Secondary | ICD-10-CM

## 2024-04-14 MED ORDER — METRONIDAZOLE 500 MG PO TABS: 500.0000 mg | ORAL_TABLET | Freq: Two times a day (BID) | ORAL | 2 refills | Status: AC

## 2024-04-14 MED ORDER — FLUCONAZOLE 150 MG PO TABS: 150.0000 mg | ORAL_TABLET | Freq: Once | ORAL | 0 refills | Status: AC

## 2024-09-26 ENCOUNTER — Ambulatory Visit: Admitting: Dermatology
